# Patient Record
Sex: Female | Born: 1940 | Race: White | Hispanic: No | Marital: Single | Smoking: Never smoker
Health system: Southern US, Community
[De-identification: ages and names within clinical notes are randomized; demographics above are authoritative.]

## PROBLEM LIST (undated history)

## (undated) DIAGNOSIS — F0390 Unspecified dementia without behavioral disturbance: Secondary | ICD-10-CM

## (undated) DIAGNOSIS — S0990XA Unspecified injury of head, initial encounter: Secondary | ICD-10-CM

## (undated) DIAGNOSIS — R51 Headache: Secondary | ICD-10-CM

## (undated) DIAGNOSIS — R413 Other amnesia: Secondary | ICD-10-CM

## (undated) DIAGNOSIS — R519 Headache, unspecified: Secondary | ICD-10-CM

## (undated) HISTORY — PX: NECK SURGERY: SHX720

## (undated) HISTORY — DX: Other amnesia: R41.3

## (undated) HISTORY — DX: Unspecified injury of head, initial encounter: S09.90XA

## (undated) HISTORY — DX: Headache, unspecified: R51.9

## (undated) HISTORY — PX: HAND SURGERY: SHX662

## (undated) HISTORY — DX: Headache: R51

## (undated) HISTORY — PX: BURN TREATMENT: SHX158

## (undated) HISTORY — PX: ABDOMINAL HYSTERECTOMY: SHX81

---

## 1997-06-13 ENCOUNTER — Ambulatory Visit (HOSPITAL_COMMUNITY): Admission: RE | Admit: 1997-06-13 | Discharge: 1997-06-13 | Payer: Self-pay | Admitting: General Surgery

## 1997-07-24 ENCOUNTER — Encounter: Admission: RE | Admit: 1997-07-24 | Discharge: 1997-10-14 | Payer: Self-pay | Admitting: Anesthesiology

## 1997-10-14 ENCOUNTER — Ambulatory Visit (HOSPITAL_BASED_OUTPATIENT_CLINIC_OR_DEPARTMENT_OTHER): Admission: RE | Admit: 1997-10-14 | Discharge: 1997-10-14 | Payer: Self-pay | Admitting: Orthopedic Surgery

## 1998-05-21 ENCOUNTER — Other Ambulatory Visit: Admission: RE | Admit: 1998-05-21 | Discharge: 1998-05-21 | Payer: Self-pay | Admitting: *Deleted

## 1998-09-21 ENCOUNTER — Encounter: Payer: Self-pay | Admitting: Emergency Medicine

## 1998-09-21 ENCOUNTER — Emergency Department (HOSPITAL_COMMUNITY): Admission: EM | Admit: 1998-09-21 | Discharge: 1998-09-21 | Payer: Self-pay | Admitting: Emergency Medicine

## 1998-09-28 ENCOUNTER — Encounter: Payer: Self-pay | Admitting: Emergency Medicine

## 1998-09-28 ENCOUNTER — Emergency Department (HOSPITAL_COMMUNITY): Admission: EM | Admit: 1998-09-28 | Discharge: 1998-09-28 | Payer: Self-pay | Admitting: Emergency Medicine

## 1999-04-13 ENCOUNTER — Other Ambulatory Visit: Admission: RE | Admit: 1999-04-13 | Discharge: 1999-04-13 | Payer: Self-pay | Admitting: Obstetrics & Gynecology

## 1999-08-30 ENCOUNTER — Ambulatory Visit (HOSPITAL_COMMUNITY): Admission: RE | Admit: 1999-08-30 | Discharge: 1999-08-30 | Payer: Self-pay | Admitting: Orthopedic Surgery

## 1999-08-30 ENCOUNTER — Encounter: Payer: Self-pay | Admitting: Orthopedic Surgery

## 1999-09-13 ENCOUNTER — Encounter: Payer: Self-pay | Admitting: Orthopedic Surgery

## 1999-09-13 ENCOUNTER — Ambulatory Visit (HOSPITAL_COMMUNITY): Admission: RE | Admit: 1999-09-13 | Discharge: 1999-09-13 | Payer: Self-pay | Admitting: Orthopedic Surgery

## 2000-12-18 ENCOUNTER — Ambulatory Visit (HOSPITAL_COMMUNITY): Admission: RE | Admit: 2000-12-18 | Discharge: 2000-12-18 | Payer: Self-pay | Admitting: Orthopedic Surgery

## 2002-03-27 ENCOUNTER — Encounter: Admission: RE | Admit: 2002-03-27 | Discharge: 2002-03-27 | Payer: Self-pay | Admitting: Family Medicine

## 2002-03-27 ENCOUNTER — Encounter: Payer: Self-pay | Admitting: Family Medicine

## 2002-06-10 ENCOUNTER — Encounter (INDEPENDENT_AMBULATORY_CARE_PROVIDER_SITE_OTHER): Payer: Self-pay

## 2002-06-10 ENCOUNTER — Ambulatory Visit (HOSPITAL_COMMUNITY): Admission: RE | Admit: 2002-06-10 | Discharge: 2002-06-10 | Payer: Self-pay | Admitting: Gastroenterology

## 2002-06-27 ENCOUNTER — Emergency Department (HOSPITAL_COMMUNITY): Admission: EM | Admit: 2002-06-27 | Discharge: 2002-06-27 | Payer: Self-pay | Admitting: Internal Medicine

## 2004-02-18 ENCOUNTER — Encounter: Admission: RE | Admit: 2004-02-18 | Discharge: 2004-02-18 | Payer: Self-pay | Admitting: General Surgery

## 2004-02-25 ENCOUNTER — Encounter: Admission: RE | Admit: 2004-02-25 | Discharge: 2004-02-25 | Payer: Self-pay | Admitting: General Surgery

## 2004-02-27 ENCOUNTER — Encounter (INDEPENDENT_AMBULATORY_CARE_PROVIDER_SITE_OTHER): Payer: Self-pay | Admitting: *Deleted

## 2004-02-27 ENCOUNTER — Ambulatory Visit (HOSPITAL_BASED_OUTPATIENT_CLINIC_OR_DEPARTMENT_OTHER): Admission: RE | Admit: 2004-02-27 | Discharge: 2004-02-27 | Payer: Self-pay | Admitting: General Surgery

## 2004-02-27 ENCOUNTER — Encounter (INDEPENDENT_AMBULATORY_CARE_PROVIDER_SITE_OTHER): Payer: Self-pay | Admitting: General Surgery

## 2004-02-27 ENCOUNTER — Ambulatory Visit (HOSPITAL_COMMUNITY): Admission: RE | Admit: 2004-02-27 | Discharge: 2004-02-27 | Payer: Self-pay | Admitting: General Surgery

## 2004-03-05 ENCOUNTER — Ambulatory Visit: Payer: Self-pay | Admitting: Oncology

## 2004-03-16 ENCOUNTER — Ambulatory Visit: Admission: RE | Admit: 2004-03-16 | Discharge: 2004-05-23 | Payer: Self-pay | Admitting: Radiation Oncology

## 2004-03-16 ENCOUNTER — Encounter: Admission: RE | Admit: 2004-03-16 | Discharge: 2004-03-16 | Payer: Self-pay | Admitting: Oncology

## 2004-03-24 ENCOUNTER — Encounter: Admission: RE | Admit: 2004-03-24 | Discharge: 2004-03-24 | Payer: Self-pay | Admitting: Radiation Oncology

## 2004-05-06 ENCOUNTER — Ambulatory Visit: Payer: Self-pay | Admitting: Oncology

## 2004-06-10 ENCOUNTER — Ambulatory Visit: Admission: RE | Admit: 2004-06-10 | Discharge: 2004-06-10 | Payer: Self-pay | Admitting: Radiation Oncology

## 2004-08-10 ENCOUNTER — Ambulatory Visit: Payer: Self-pay | Admitting: Oncology

## 2004-11-05 ENCOUNTER — Ambulatory Visit: Payer: Self-pay | Admitting: Oncology

## 2005-02-04 ENCOUNTER — Ambulatory Visit: Payer: Self-pay | Admitting: Oncology

## 2009-01-30 ENCOUNTER — Emergency Department (HOSPITAL_COMMUNITY): Admission: EM | Admit: 2009-01-30 | Discharge: 2009-01-30 | Payer: Self-pay | Admitting: Emergency Medicine

## 2010-06-11 NOTE — Op Note (Signed)
[facility]  Patient:    [name], [name] Visit Number: [id number] MRN: [id number]          Service Type: DSU Location: DAY Attending Physician:  Verlee Rossetti. Dictated by:   Almedia Balls Ranell Patrick, M.D. Proc. Date: 12/18/00 Admit Date:  12/18/2000                             Operative Report  PREOPERATIVE DIAGNOSES: 1. Left shoulder impingement syndrome, chronic. 2. Left shoulder acromioclavicular joint arthritis.  POSTOPERATIVE DIAGNOSES: 1. Left shoulder impingement syndrome, chronic. 3. Left shoulder acromioclavicular joint arthritis.  PROCEDURE PERFORMED:  Left shoulder exam under anesthesia, diagnostic glenohumeral arthroscopy, debridement of superior labral fraying and arthroscopic subacromial decompression followed by open distal clavicle excision.  SURGEON:  Almedia Balls. Ranell Patrick, M.D.  FIRST ASSISTANT:  Dooley L. Underwood III, P.A.-C.  ANESTHESIA:  Intrascalene block plus general anesthesia.  ESTIMATED BLOOD LOSS:  Minimal.  FLUID REPLACEMENT:  1200 cc crystalloid.  INSTRUMENT COUNT:  Correct.  COMPLICATIONS:  None.  Preoperative antibiotics were given.  INDICATIONS:  The patient is a 70 year old female, who presents complaining of longstanding left shoulder pain.  She complains of pain with activities away from her body and over her head.  In addition, she has pain with cross arm abduction.  On x-rays, she has evidence of AC joint arthritis and subacromial spur formation.  MRI scan demonstrates changes within the supraspinatus tendon consistent with chronic impingement syndrome.  The patient has failed longstanding conservative management with activity modification, physical therapy, and cortisone injections in the subacromial space.  She presents now desiring subacromial decompression and AC joint resection.  DESCRIPTION OF PROCEDURE:  After an adequate level of general anesthesia plus intrascalene block anesthesia was achieved  plus 1 g of Ancef was given preoperatively, the patient was positioned in the modified beach chair position.  All neurovascular structures were padded appropriately.  The left shoulder was then examined under anesthesia, and there was noted to be a negative sulcus, negative anterior-posterior drawer external rotation of 45 degrees, forward flexion of 130, abduction 110.  After completion of EUA, the left shoulder was prepped and draped in its entirety in the usual sterile fashion.  Diagnostic arthroscopy was performed through standard posterior and anterior arthroscopic portals, created in a similar fashion with infiltration of the skin with 0.5% Marcaine with epinephrine followed by incision with an 11 blade scalpel and introduction of cannula into the joint using the blunt obturator.  Diagnostic arthroscopy revealed normal biceps anchor.  There was some mild fraying of the superior labrum debrided with a full-radius resector. The remainder of the labrum appeared normal.  Subscapularis appeared normal with a normal insertion on the humerus.  The rotator cuff also was normal. There were no loose bodies noted in the pouch which was of normal size. Glenohumeral articular cartilage appeared intact.  After completion of glenohumeral arthroscopy, the scope was placed in the subacromial space, and bursectomy was performed, and then an arthroscopic subacromial decompression with care towards removing anterior and lateral osteophytes.  The [facility] joint was visualized, and the acromioplasty was taken all the way over to the [facility] joint where small acromioclavicular osteophytes were noted.  At this point, the scope was concluded.  A small saber incision was created over the distal clavicle in line with Langers lines.  This was done using a 10 blade scalpel. Dissection was carried sharply down through subcutaneous tissues using Bovie electrocautery.  Next the deltotrapezius fascia was identified and incised  in line with the distal clavicle.  Subperiosteal dissection of the distal clavicle was performed, and then the distal 1 cm distal clavicle was removed using an oscillating saw.  The operating surgeons finger was then placed in the interval and the arm maximally adducted and abducted to ensure an adequate resection.  Thorough irrigation was performed followed by application bone wax to the end of the clavicle.  Again, the [facility] interval was irrigated.  The deltotrapezius fascia was repaired using interrupted figure-of-eight 0 Ethibond sutures with buried knots.  The the subcu was closed using 2-0 Vicryl followed by skin with a 4-0 running Monocryl subcuticular.  The portals were also closed using 4-0 Monocryl subcuticular sutures.  Steri-Strips were applied followed by sterile dressing and a shoulder sling.  The patient was awakened, extubated, and taken to PACU in stable condition having tolerated surgery well. Dictated by:   Almedia Balls Ranell Patrick, M.D. Attending Physician:  Malon Kindle R. DD:  12/18/00 TD:  12/18/00 Job: [id number]

## 2010-06-11 NOTE — Op Note (Signed)
   [name], [name]                          ACCOUNT NO.:  [id number]   MEDICAL RECORD NO.:  [id number]                   PATIENT TYPE:  AMB   LOCATION:  ENDO                                 FACILITY:  [facility]   PHYSICIAN:  James L. Malon Kindle., M.D.          DATE OF BIRTH:  07-16-1940   DATE OF PROCEDURE:  06/10/2002  DATE OF DISCHARGE:                                 OPERATIVE REPORT   PROCEDURE:  Colonoscopy and coagulation of polyps.   MEDICATIONS:  Fentanyl 87.5 mcg, Versed 7 mg IV.   SCOPE:  Olympus pediatric colonoscope   INDICATIONS:  Colon cancer screening.   DESCRIPTION OF PROCEDURE:  The procedure had been explained to the patient  and consent obtained.  The patient was placed in the left lateral decubitus  position.  The Olympus adjustable pediatric video colonoscope was inserted  and advanced.  The patient had a long tortuous colon. Using abdominal  pressure, we were able to reach the cecum.  The ileocecal valve and  appendiceal orifice were seen.  The scope was withdrawn and the  cecum,  ascending colon, transverse colon, descending, and sigmoid colon were seen  well.  No significant diverticula disease.  The scope was withdrawn down  into the rectum.  The rectum did reveal a 3-4 mm sessile polyp that was  cauterized with the hot biopsy forceps.  No other polyps were seen.  There  were a few small internal hemorrhoids.  The scope was withdrawn.  The  patient tolerated the procedure well.  She was maintained on low-flow oxygen  with pulse oximetry throughout the procedure.   ASSESSMENT:  1. Rectal polyp cauterized.  2. Small internal hemorrhoids.   PLAN:  Will check pathology and make further recommendations depending on  the results and we will go ahead and routine post polypectomy precautions.                                                James L. Malon Kindle., M.D.    Waldron Session  D:  06/10/2002  T:  06/10/2002  Job:  [id number]

## 2010-06-11 NOTE — Op Note (Signed)
[name], Villarreal                ACCOUNT NO.:  [id number]   MEDICAL RECORD NO.:  [id number]          PATIENT TYPE:  AMB   LOCATION:  DSC                          FACILITY:  [facility]   PHYSICIAN:  Sharlet Salina T. Hoxworth, M.D.DATE OF BIRTH:  January 11, 1941   DATE OF PROCEDURE:  02/27/2004  DATE OF DISCHARGE:                                 OPERATIVE REPORT   PREOPERATIVE DIAGNOSIS:  Carcinoma of right breast.   POSTOPERATIVE DIAGNOSIS:  Carcinoma of right breast.   SURGICAL PROCEDURE:  1.  Blue dye injection of right breast.  2.  Right axillary sentinel lymph node biopsy.  3.  Right breast lumpectomy.   SURGEON:  Lorne Skeens. Hoxworth, M.D.   ANESTHESIA:  Laryngeal mask general.   BRIEF HISTORY:  [name] is a 70 year old female who recently noticed a  small lump in her upper breast.  Mammogram and ultrasound confirmed a 1.5-cm  suspicious mass in the upper right breast at 1 o'clock position and  ultrasound-guided large-core needle biopsy has revealed invasive ductal  carcinoma.  MRI was negative for any other areas of concern.  Treatment  options were discussed extensively and we elected to proceed with right  axillary sentinel lymph node biopsy with possible axillary dissection and  right lumpectomy.  The nature of the procedure, indications, risks of  bleeding, infection and possible for further surgery based on final  pathology were discussed and understood.  She is now brought to the  operating room for this procedure.   DESCRIPTION OF OPERATION:  The patient's right breast was injected  circumareolarly with Tc 9m sulfur colloid approximately 2 hours prior to  the procedure.  She was brought to the operating room and placed in supine  position on the operating table, and laryngeal mask general anesthesia was  induced.  The right chest, axilla and arm were sterilely prepped and draped.  Prior to draping, under sterile technique, 10 mL of Lymphazurin blue had  been injected  subareolarly and massaged for several minutes.  The Neoprobe  was used to localize a definite hot area in the right axilla.  A small  incision was made over this and dissection carried down through the  subcutaneous tissue and clavipectoral fascia with cautery.  Using careful  blunt dissection and directed by the Neoprobe, dissection was carried down  onto a bright blue lymph node which was very hot on Neoprobe.  This was  dissected free from the surrounding tissue using blunt and cautery  dissection, and was removed intact.  Ex vivo, the node had counts of 700-800  with background of only about 10.  This was sent as the hot blue sentinel  lymph node.  While awaiting this result, I proceeded with the lumpectomy.  The palpable mass was quite high in the breast, a little bit medial to  midline.  A curvilinear incision was made over the mass and dissection  carried down into the subcutaneous tissue.  The breast tissue was fairly  thin at this point; I did have to create short skin flaps superiorly and  inferiorly.  Dissection was then deepened  down surrounding the mass with  nice margins in all direction.  Because of again very thin breast tissue in  this area, the dissection was deepened down to the level of the pectoralis  muscle and the specimen reflected up off the pectoralis muscle including the  pectoralis fascia with the specimen, as it was deep and in thin breast  tissue.  It appeared completely excised.  The specimen was oriented and sent  for permanent exam.  At this point, the sentinel lymph node touch prep  returned as negative for malignant cells.  Each incision was infiltrated  with Marcaine and complete hemostasis assured.  Each incision was closed  with interrupted subcutaneous 4-0 Monocryl and running subcuticular 4-0  Monocryl and Steri-Strips.  Sponge and instrument counts were correct.  Dry  sterile dressings were applied and the patient taken to the recovery room in  good  condition.      BTH/MEDQ  D:  02/27/2004  T:  02/28/2004  Job:  [id number]

## 2013-06-26 ENCOUNTER — Encounter (HOSPITAL_COMMUNITY): Payer: Self-pay | Admitting: Emergency Medicine

## 2013-06-26 ENCOUNTER — Emergency Department (HOSPITAL_COMMUNITY): Payer: Medicare Other

## 2013-06-26 ENCOUNTER — Emergency Department (HOSPITAL_COMMUNITY)
Admission: EM | Admit: 2013-06-26 | Discharge: 2013-06-26 | Disposition: A | Discharge status: Home / Self Care | Payer: Medicare Other | Attending: Emergency Medicine | Admitting: Emergency Medicine

## 2013-06-26 DIAGNOSIS — Z9889 Other specified postprocedural states: Secondary | ICD-10-CM | POA: Insufficient documentation

## 2013-06-26 DIAGNOSIS — G8911 Acute pain due to trauma: Secondary | ICD-10-CM | POA: Insufficient documentation

## 2013-06-26 DIAGNOSIS — M542 Cervicalgia: Secondary | ICD-10-CM | POA: Insufficient documentation

## 2013-06-26 MED ORDER — HYDROCODONE-ACETAMINOPHEN 5-325 MG PO TABS
1.0000 | ORAL_TABLET | Freq: Four times a day (QID) | ORAL | Status: DC | PRN
Start: 2013-06-26 — End: 2015-07-20

## 2013-06-26 NOTE — ED Notes (Signed)
MD at bedside. 

## 2013-06-26 NOTE — ED Notes (Signed)
Pt reports approx 1 year ago fell and has had pain in neck off and on since.  Reports has been having a hard time getting medical treatment because of medicare.    Pt reports has referral to Dr. Danielle Dess and has appt in July.  Pt says Dr. Tenny Craw set up an appt for physical therapy.  Reports was at PT today and sent here for evaluation.  Pt says was concerned about doing the PT because of her neck pain.  Pt says has stopped breathing before due to her neck.

## 2013-06-26 NOTE — Discharge Instructions (Signed)
Follow up with Dr. Danielle Dess as planned.  Return if needed
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
## 2013-08-08 ENCOUNTER — Other Ambulatory Visit: Payer: Self-pay | Admitting: Neurological Surgery

## 2013-08-08 DIAGNOSIS — M5000 Cervical disc disorder with myelopathy, unspecified cervical region: Secondary | ICD-10-CM

## 2013-08-19 ENCOUNTER — Ambulatory Visit
Admission: RE | Admit: 2013-08-19 | Discharge: 2013-08-19 | Disposition: A | Discharge status: Home / Self Care | Payer: Medicare Other | Source: Ambulatory Visit | Attending: Neurological Surgery | Admitting: Neurological Surgery

## 2013-08-19 DIAGNOSIS — M5000 Cervical disc disorder with myelopathy, unspecified cervical region: Secondary | ICD-10-CM

## 2015-07-20 ENCOUNTER — Ambulatory Visit (INDEPENDENT_AMBULATORY_CARE_PROVIDER_SITE_OTHER): Payer: Medicare Other | Admitting: Neurology

## 2015-07-20 ENCOUNTER — Encounter: Payer: Self-pay | Admitting: Neurology

## 2015-07-20 VITALS — BP 146/88 | HR 72 | Ht 66.0 in | Wt 137.8 lb

## 2015-07-20 DIAGNOSIS — G44221 Chronic tension-type headache, intractable: Secondary | ICD-10-CM | POA: Diagnosis not present

## 2015-07-20 DIAGNOSIS — G3184 Mild cognitive impairment, so stated: Secondary | ICD-10-CM

## 2015-07-20 DIAGNOSIS — G43109 Migraine with aura, not intractable, without status migrainosus: Secondary | ICD-10-CM | POA: Diagnosis not present

## 2015-07-20 DIAGNOSIS — G43909 Migraine, unspecified, not intractable, without status migrainosus: Secondary | ICD-10-CM | POA: Insufficient documentation

## 2015-07-20 DIAGNOSIS — G8929 Other chronic pain: Secondary | ICD-10-CM | POA: Insufficient documentation

## 2015-07-20 DIAGNOSIS — R519 Headache, unspecified: Secondary | ICD-10-CM | POA: Insufficient documentation

## 2015-07-20 DIAGNOSIS — R51 Headache: Secondary | ICD-10-CM

## 2015-07-20 MED ORDER — SUMATRIPTAN SUCCINATE 25 MG PO TABS: 25.0000 mg | ORAL_TABLET | ORAL | Status: DC | PRN

## 2015-07-20 MED ORDER — NORTRIPTYLINE HCL 10 MG PO CAPS: ORAL_CAPSULE | ORAL | Status: DC
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
## 2015-07-22 DIAGNOSIS — G3184 Mild cognitive impairment, so stated: Secondary | ICD-10-CM | POA: Insufficient documentation

## 2015-09-15 ENCOUNTER — Ambulatory Visit: Payer: Medicare Other | Admitting: Neurology

## 2018-09-04 ENCOUNTER — Encounter (HOSPITAL_COMMUNITY): Payer: Self-pay | Admitting: Emergency Medicine

## 2018-09-04 ENCOUNTER — Emergency Department (HOSPITAL_COMMUNITY): Payer: Medicare Other

## 2018-09-04 ENCOUNTER — Emergency Department (HOSPITAL_COMMUNITY)
Admission: EM | Admit: 2018-09-04 | Discharge: 2018-09-04 | Disposition: A | Discharge status: Home / Self Care | Payer: Medicare Other | Attending: Emergency Medicine | Admitting: Emergency Medicine

## 2018-09-04 ENCOUNTER — Other Ambulatory Visit: Payer: Self-pay

## 2018-09-04 DIAGNOSIS — W228XXA Striking against or struck by other objects, initial encounter: Secondary | ICD-10-CM | POA: Insufficient documentation

## 2018-09-04 DIAGNOSIS — Z23 Encounter for immunization: Secondary | ICD-10-CM | POA: Insufficient documentation

## 2018-09-04 DIAGNOSIS — Y9289 Other specified places as the place of occurrence of the external cause: Secondary | ICD-10-CM | POA: Diagnosis not present

## 2018-09-04 DIAGNOSIS — S4991XA Unspecified injury of right shoulder and upper arm, initial encounter: Secondary | ICD-10-CM

## 2018-09-04 DIAGNOSIS — Y9389 Activity, other specified: Secondary | ICD-10-CM | POA: Insufficient documentation

## 2018-09-04 DIAGNOSIS — Y998 Other external cause status: Secondary | ICD-10-CM | POA: Insufficient documentation

## 2018-09-04 DIAGNOSIS — S51841A Puncture wound with foreign body of right forearm, initial encounter: Secondary | ICD-10-CM | POA: Diagnosis present

## 2018-09-04 DIAGNOSIS — M795 Residual foreign body in soft tissue: Secondary | ICD-10-CM

## 2018-09-04 MED ORDER — OXYCODONE-ACETAMINOPHEN 5-325 MG PO TABS
1.0000 | ORAL_TABLET | Freq: Once | ORAL | Status: AC
  Administered 2018-09-04: 1 via ORAL
  Filled 2018-09-04: qty 1

## 2018-09-04 MED ORDER — TETANUS-DIPHTH-ACELL PERTUSSIS 5-2.5-18.5 LF-MCG/0.5 IM SUSP
0.5000 mL | Freq: Once | INTRAMUSCULAR | Status: AC
  Administered 2018-09-04: 17:00:00 0.5 mL via INTRAMUSCULAR
  Filled 2018-09-04: qty 0.5

## 2018-09-04 MED ORDER — OXYCODONE-ACETAMINOPHEN 5-325 MG PO TABS: 1.0000 | ORAL_TABLET | ORAL | 0 refills | Status: AC | PRN

## 2018-09-04 MED ORDER — CLINDAMYCIN HCL 150 MG PO CAPS: 450.0000 mg | ORAL_CAPSULE | Freq: Three times a day (TID) | ORAL | 0 refills | Status: AC

## 2018-09-04 MED ORDER — LIDOCAINE HCL (PF) 1 % IJ SOLN
5.0000 mL | Freq: Once | INTRAMUSCULAR | Status: AC
  Administered 2018-09-04: 5 mL via INTRADERMAL
  Filled 2018-09-04: qty 6

## 2018-09-04 NOTE — ED Provider Notes (Signed)
Medical screening examination/treatment/procedure(s) were conducted as a shared visit with non-physician practitioner(s) and myself.  I personally evaluated the patient during the encounter.    77yF with lacerations to R forearm caused by tree branch.  She thinks it came out largely intact. Through and through on dorsal aspect. No gross contamination that I can obviously tell but there is quite a bit of soft tissue in between the two wounds that can't be visualized unless you significantly extended them. NVI. XR w/o obvious FB. We will anesthetize and aggressive irrigate the wounds. Since caused by friable organic material, I think best to leave them open. Discussed with patient. Will need diligent wound care. Will place on abx. Return precautions discussed.    Virgel Manifold, MD 09/04/18 [phone]

## 2018-09-04 NOTE — ED Notes (Signed)
Advised patient not to drive after discharge due to narcotic medication administration. Also advised patient not to drive while taking prescription pain medications. Patient verbalized understanding. Discharged to waiting room to wait for ride home.

## 2018-09-04 NOTE — ED Provider Notes (Signed)
[facility]ENCY [facility] Provider Note   CSN: [id number] Arrival date & time: 09/04/18  1501    History   Chief Complaint Chief Complaint  Patient presents with   Arm Injury    HPI [name] is a 78 y.o. female.     78 y.o female with a PMH of Migraine presents to the ED s/p tree injury. Patient reports she was mowing the lawn when she cut a tree branch and witnessed a tree branch go into her right forearm. She reports pain along the dorsum aspect of the forearm, states its worse with palpation along with the feeling of being there and with extension of the wrist. She has not tried any medication for relieve in symptoms. Reports no other injuries or trauma.   The history is provided by the patient and medical records.  Arm Injury Associated symptoms: no back pain and no fever     Past Medical History:  Diagnosis Date   Head injury    Headache    Memory loss     Patient Active Problem List   Diagnosis Date Noted   Mild cognitive impairment 07/22/2015   Chronic headache 07/20/2015   Migraine 07/20/2015    Past Surgical History:  Procedure Laterality Date   ABDOMINAL HYSTERECTOMY     BURN TREATMENT     HAND SURGERY     NECK SURGERY       OB History   No obstetric history on file.      Home Medications    Prior to Admission medications   Medication Sig Start Date End Date Taking? Authorizing Provider  Aspirin Buf,CaCarb-MgCarb-MgO, (BUFFERIN PO) Take by mouth as needed.    [provider]  clindamycin (CLEOCIN) 150 MG capsule Take 3 capsules (450 mg total) by mouth 3 (three) times daily for 7 days. 09/04/18 09/11/18  Claude MangesSoto, Derius Ghosh, PA-C  Multiple Vitamin (MULTIVITAMIN) tablet Take 1 tablet by mouth daily.    [provider]  nortriptyline (PAMELOR) 10 MG capsule One po qhs xone week, then 2 tabs po qhs 07/20/15   Levert FeinsteinYan, Yijun, MD  oxyCODONE-acetaminophen (PERCOCET/ROXICET) 5-325 MG tablet Take 1 tablet by mouth every 4  (four) hours as needed for up to 3 days for severe pain. 09/04/18 09/07/18  Claude MangesSoto, Taleigh Gero, PA-C  SUMAtriptan (IMITREX) 25 MG tablet Take 1 tablet (25 mg total) by mouth every 2 (two) hours as needed for migraine. May repeat in 2 hours if headache persists or recurs. 07/20/15   Levert FeinsteinYan, Yijun, MD  vitamin C (ASCORBIC ACID) 500 MG tablet Take 500 mg by mouth daily.    [provider]  vitamin E 400 UNIT capsule Take 400 Units by mouth daily.    [provider]    Family History Family History  Problem Relation Age of Onset   Pneumonia Father    Lung cancer Mother     Social History Social History   Tobacco Use   Smoking status: Never Smoker   Smokeless tobacco: Never Used   Tobacco comment: Quit [phone]  Substance Use Topics   Alcohol use: No    Alcohol/week: 0.0 standard drinks   Drug use: No     Allergies   Patient has no known allergies.   Review of Systems Review of Systems  Constitutional: Negative for fever.  HENT: Negative for sore throat.   Eyes: Negative for redness.  Respiratory: Negative for cough.   Gastrointestinal: Negative for abdominal pain, nausea and vomiting.  Genitourinary: Negative for flank  pain.  Musculoskeletal: Positive for myalgias. Negative for back pain.  Skin: Positive for wound.  Neurological: Negative for light-headedness and headaches.     Physical Exam Updated Vital Signs BP (!) 151/92 (BP Location: Left Arm)    Pulse (!) 110    Temp 98.1 F (36.7 C) (Oral)    Resp 16    Ht 5\' 6"  (1.676 m)    Wt 56.7 kg    SpO2 95%    BMI 20.18 kg/m   Physical Exam Vitals signs and nursing note reviewed.  Constitutional:      General: She is not in acute distress.    Appearance: She is well-developed.  HENT:     Head: Normocephalic and atraumatic.     Mouth/Throat:     Pharynx: No oropharyngeal exudate.  Eyes:     Pupils: Pupils are equal, round, and reactive to light.  Neck:     Musculoskeletal: Normal range of motion.    Cardiovascular:     Rate and Rhythm: Regular rhythm.     Heart sounds: Normal heart sounds.  Pulmonary:     Effort: Pulmonary effort is normal. No respiratory distress.     Breath sounds: Normal breath sounds.  Abdominal:     General: Bowel sounds are normal. There is no distension.     Palpations: Abdomen is soft.     Tenderness: There is no abdominal tenderness.  Musculoskeletal:        General: No deformity.     Right forearm: She exhibits tenderness and swelling. She exhibits no bony tenderness, no edema, no deformity and no laceration.     Right lower leg: No edema.     Left lower leg: No edema.     Comments: Significant swelling, tenderness, bruising noted to right forearm, entrance and exit wound observed, however patient reports tree branch on right forearm. Pulses present, pain with wrist extension.   Skin:    General: Skin is warm and dry.  Neurological:     Mental Status: She is alert and oriented to person, place, and time.      ED Treatments / Results  Labs (all labs ordered are listed, but only abnormal results are displayed) Labs Reviewed - No data to display  EKG None  Radiology Dg Forearm Right  Result Date: 09/04/2018 CLINICAL DATA:  Distal right forearm injury by tree branch, swelling with puncture wound EXAM: RIGHT FOREARM - 2 VIEW COMPARISON:  None. FINDINGS: Punctate subcutaneous gas in the soft tissues along the dorsal and radial aspect of the distal right forearm with associated soft tissue swelling and skin laceration. No radiopaque foreign body is visualized. No subjacent fracture or traumatic malalignment of the right forearm. Alignment at the elbow is grossly preserved with minimal degenerative changes in TS pathic spurring of the olecranon. There is mild negative ulnar variance at the wrist as well as radiocarpal and intercarpal arthrosis. IMPRESSION: Punctate subcutaneous gas in the soft tissues along the dorsal and radial aspect of the distal right  forearm with associated soft tissue swelling and skin laceration. No radiopaque foreign body or acute osseous abnormality. Degenerative changes of the elbow and wrist with negative ulnar variance. Electronically Signed   By: Kreg ShropshirePrice  DeHay M.D.   On: 09/04/2018 15:42     Procedures Irrigation  Date/Time: 09/04/2018 4:45 PM Performed by: Claude MangesSoto, Kathia Covington, PA-C Authorized by: Claude MangesSoto, Jorgina Binning, PA-C  Consent: Verbal consent obtained. Consent given by: patient Patient identity confirmed: verbally with patient and hospital-assigned identification number Local anesthesia  used: yes  Anesthesia: Local anesthesia used: yes Local Anesthetic: lidocaine 1% without epinephrine Anesthetic total: 2 mL  Sedation: Patient sedated: no  Patient tolerance: patient tolerated the procedure well with no immediate complications Comments: Patient's wound extensively irrigated with thousand mL's of normal saline.  Unable to remove foreign body tree branch.  Wrapped with nonadherent dressing and bulky dressing provided.    (including critical care time)  Medications Ordered in ED Medications  Tdap (BOOSTRIX) injection 0.5 mL (has no administration in time range)  lidocaine (PF) (XYLOCAINE) 1 % injection 5 mL (5 mLs Intradermal Given 09/04/18 1551)  oxyCODONE-acetaminophen (PERCOCET/ROXICET) 5-325 MG per tablet 1 tablet (1 tablet Oral Given 09/04/18 1551)     Initial Impression / Assessment and Plan / ED Course  I have reviewed the triage vital signs and the nursing notes.  Pertinent labs & imaging results that were available during my care of the patient were reviewed by me and considered in my medical decision making (see chart for details).    Patient presents status post injury of the right forearm while mowing her lawn, states she had a small tree branch go into her right forearm.  Significant bleeding was noted on arrival in triage, bleeding has stopped on my exam.  2 wounds are noted on entrance wound along  with an exit wound.  Unsure whether foreign bodies remains in right forearm.  I have discussed patient with Dr. Wilson Singer who has seen patient and agrees with management and plan.   Right forearm xray showed: Punctate subcutaneous gas in the soft tissues along the dorsal and  radial aspect of the distal right forearm with associated soft  tissue swelling and skin laceration. No radiopaque foreign body or  acute osseous abnormality.    Degenerative changes of the elbow and wrist with negative ulnar  variance.   4:51 PM Patient's wound was extensively irrigated with 1 L of saline, pressure was applied during irrigation.  Patient was dressed with nonadherent dressing along with bulky dressing applied.  Tetanus vaccine was updated on today's visit.  She will also go home on clindamycin to prevent infection.  She is advised to return within 48 hours for a wound recheck.  Patient stable for discharge, with stable vital signs.  Patient stable for discharge.   Portions of this note were generated with [employer]. Dictation errors may occur despite best attempts at proofreading.  Final Clinical Impressions(s) / ED Diagnoses   Final diagnoses:  Injury of right upper extremity, initial encounter  Foreign body (FB) in soft tissue    ED Discharge Orders         Ordered    oxyCODONE-acetaminophen (PERCOCET/ROXICET) 5-325 MG tablet  Every 4 hours PRN     09/04/18 1616    clindamycin (CLEOCIN) 150 MG capsule  3 times daily     09/04/18 1646           Janeece Fitting, PA-C 09/04/18 1654    Virgel Manifold, MD 09/06/18 [phone]

## 2018-09-04 NOTE — Discharge Instructions (Addendum)
I have prescribed antibiotics to help prevent infection to your right forearm.  Please take 3 tablets three times a day for the next 7 days.  Please keep your wound clean and dry, I provided extra dressing to help with this.  Please return to the emergency department within 48 hours for a wound recheck.  Your tetanus status was updated today.  If you experience any drainage, fever, worsening pain please return prior to 48 hours.

## 2018-09-04 NOTE — ED Triage Notes (Signed)
Patient states she was cutting grass today and a "tree branch went into my arm." Patient bleeding from right forearm with significant swelling at triage.

## 2018-09-06 ENCOUNTER — Other Ambulatory Visit: Payer: Self-pay

## 2018-09-06 ENCOUNTER — Encounter (HOSPITAL_COMMUNITY): Payer: Self-pay | Admitting: Emergency Medicine

## 2018-09-06 ENCOUNTER — Emergency Department (HOSPITAL_COMMUNITY)
Admission: EM | Admit: 2018-09-06 | Discharge: 2018-09-06 | Disposition: A | Discharge status: Home / Self Care | Payer: Medicare Other | Attending: Emergency Medicine | Admitting: Emergency Medicine

## 2018-09-06 DIAGNOSIS — Z5189 Encounter for other specified aftercare: Secondary | ICD-10-CM

## 2018-09-06 DIAGNOSIS — Z79899 Other long term (current) drug therapy: Secondary | ICD-10-CM | POA: Diagnosis not present

## 2018-09-06 DIAGNOSIS — Z4800 Encounter for change or removal of nonsurgical wound dressing: Secondary | ICD-10-CM | POA: Insufficient documentation

## 2018-09-06 NOTE — ED Provider Notes (Signed)
[facility] Provider [facility]   CSN: [id number] Arrival date & time: 09/06/18  1103     History   Chief Complaint Chief Complaint  Patient presents with  . Wound Check    HPI Luiz Blarelaine L Sosinski is a 78 y.o. female.     The history is provided by the patient. No language interpreter was used.  Wound Check This is a recurrent problem. The current episode started 2 days ago. The problem occurs constantly. Nothing aggravates the symptoms. Nothing relieves the symptoms. She has tried nothing for the symptoms. The treatment provided no relief.  Pt is here for a wound check.   Pt  had a puncture wound.  She was seen here 2 days ago and advised to recheck here.  Pt reports she is healing well                                    Past Medical History:  Diagnosis Date  . Head injury   . Headache   . Memory loss     Patient Active Problem List   Diagnosis Date Noted  . Mild cognitive impairment 07/22/2015  . Chronic headache 07/20/2015  . Migraine 07/20/2015    Past Surgical History:  Procedure Laterality Date  . ABDOMINAL HYSTERECTOMY    . BURN TREATMENT    . HAND SURGERY    . NECK SURGERY       OB History    Gravida  3   Para  3   Term  3   Preterm      AB      Living        SAB      TAB      Ectopic      Multiple      Live Births               Home Medications    Prior to Admission medications   Medication Sig Start Date End Date Taking? Authorizing Provider  Aspirin Buf,CaCarb-MgCarb-MgO, (BUFFERIN PO) Take by mouth as needed.    [provider]  clindamycin (CLEOCIN) 150 MG capsule Take 3 capsules (450 mg total) by mouth 3 (three) times daily for 7 days. 09/04/18 09/11/18  Claude MangesSoto, Johana, PA-C  Multiple Vitamin (MULTIVITAMIN) tablet Take 1 tablet by mouth daily.    [provider]  nortriptyline (PAMELOR) 10 MG capsule One po qhs xone week, then 2 tabs po qhs 07/20/15   Levert FeinsteinYan, Yijun,  MD  oxyCODONE-acetaminophen (PERCOCET/ROXICET) 5-325 MG tablet Take 1 tablet by mouth every 4 (four) hours as needed for up to 3 days for severe pain. 09/04/18 09/07/18  Claude MangesSoto, Johana, PA-C  SUMAtriptan (IMITREX) 25 MG tablet Take 1 tablet (25 mg total) by mouth every 2 (two) hours as needed for migraine. May repeat in 2 hours if headache persists or recurs. 07/20/15   Levert FeinsteinYan, Yijun, MD  vitamin C (ASCORBIC ACID) 500 MG tablet Take 500 mg by mouth daily.    [provider]  vitamin E 400 UNIT capsule Take 400 Units by mouth daily.    [provider]    Family History Family History  Problem Relation Age of Onset  . Pneumonia Father   . Lung cancer Mother     Social History Social History   Tobacco Use  . Smoking status: Never Smoker  . Smokeless tobacco: Never Used  . Tobacco comment:  Quit [id number]  Substance Use Topics  . Alcohol use: No    Alcohol/week: 0.0 standard drinks  . Drug use: No     Allergies   Patient has no known allergies.   Review of Systems Review of Systems  All other systems reviewed and are negative.    Physical Exam Updated Vital Signs BP (!) 156/101 (BP Location: Left Arm)   Pulse 95   Temp 98.2 F (36.8 C) (Oral)   Resp 18   SpO2 96%   Physical Exam Vitals signs reviewed.  Musculoskeletal: Normal range of motion.  Skin:    General: Skin is warm.     Comments: Healing skin lacerations forearm,  No sign of infection   Neurological:     General: No focal deficit present.  Psychiatric:        Mood and Affect: Mood normal.      ED Treatments / Results  Labs (all labs ordered are listed, but only abnormal results are displayed) Labs Reviewed - No data to display  EKG None  Radiology Dg Forearm Right  Result Date: 09/04/2018 CLINICAL DATA:  Distal right forearm injury by tree branch, swelling with puncture wound EXAM: RIGHT FOREARM - 2 VIEW COMPARISON:  None. FINDINGS: Punctate subcutaneous gas in the soft tissues  along the dorsal and radial aspect of the distal right forearm with associated soft tissue swelling and skin laceration. No radiopaque foreign body is visualized. No subjacent fracture or traumatic malalignment of the right forearm. Alignment at the elbow is grossly preserved with minimal degenerative changes in TS pathic spurring of the olecranon. There is mild negative ulnar variance at the wrist as well as radiocarpal and intercarpal arthrosis. IMPRESSION: Punctate subcutaneous gas in the soft tissues along the dorsal and radial aspect of the distal right forearm with associated soft tissue swelling and skin laceration. No radiopaque foreign body or acute osseous abnormality. Degenerative changes of the elbow and wrist with negative ulnar variance. Electronically Signed   By: Lovena Le M.D.   On: 09/04/2018 15:42    Procedures Procedures (including critical care time)  Medications Ordered in ED Medications - No data to display   Initial Impression / Assessment and Plan / ED Course  I have reviewed the triage vital signs and the nursing notes.  Pertinent labs & imaging results that were available during my care of the patient were reviewed by me and considered in my medical decision making (see chart for details).        MDM   Pt advised to continue antibiotics.  She is advised toreturn if any problems.   Final Clinical Impressions(s) / ED Diagnoses   Final diagnoses:  Visit for wound check    ED Discharge Orders    None    An After Visit Summary was printed and given to the patient.    Fransico Meadow, [place] 09/06/18 1318    Lucrezia Starch, MD 09/07/18 [phone]

## 2018-09-06 NOTE — ED Notes (Signed)
ED Provider at bedside. 

## 2018-09-06 NOTE — Discharge Instructions (Signed)
Watch for infection.  Return if any problems.  

## 2018-09-06 NOTE — ED Triage Notes (Signed)
Patient seen here yesterday for treatment of a wound to her R arm. Told to return for a wound recheck today.

## 2019-03-27 ENCOUNTER — Other Ambulatory Visit: Payer: Self-pay | Admitting: Oncology

## 2019-05-10 ENCOUNTER — Emergency Department (HOSPITAL_COMMUNITY): Payer: Medicare Other

## 2019-05-10 ENCOUNTER — Other Ambulatory Visit: Payer: Self-pay

## 2019-05-10 ENCOUNTER — Emergency Department (HOSPITAL_COMMUNITY)
Admission: EM | Admit: 2019-05-10 | Discharge: 2019-05-11 | Disposition: A | Discharge status: Home / Self Care | Payer: Medicare Other | Attending: Emergency Medicine | Admitting: Emergency Medicine

## 2019-05-10 ENCOUNTER — Encounter (HOSPITAL_COMMUNITY): Payer: Self-pay | Admitting: Pediatrics

## 2019-05-10 DIAGNOSIS — Z79899 Other long term (current) drug therapy: Secondary | ICD-10-CM | POA: Insufficient documentation

## 2019-05-10 DIAGNOSIS — M79671 Pain in right foot: Secondary | ICD-10-CM | POA: Diagnosis not present

## 2019-05-10 DIAGNOSIS — M542 Cervicalgia: Secondary | ICD-10-CM | POA: Insufficient documentation

## 2019-05-10 DIAGNOSIS — Z5321 Procedure and treatment not carried out due to patient leaving prior to being seen by health care provider: Secondary | ICD-10-CM | POA: Diagnosis not present

## 2019-05-10 DIAGNOSIS — R519 Headache, unspecified: Secondary | ICD-10-CM | POA: Insufficient documentation

## 2019-05-10 LAB — COMPREHENSIVE METABOLIC PANEL
ALT: 12 U/L (ref 0–44)
AST: 20 U/L (ref 15–41)
Albumin: 3.5 g/dL (ref 3.5–5.0)
Alkaline Phosphatase: 90 U/L (ref 38–126)
Anion gap: 11 (ref 5–15)
BUN: 15 mg/dL (ref 8–23)
CO2: 23 mmol/L (ref 22–32)
Calcium: 10.2 mg/dL (ref 8.9–10.3)
Chloride: 101 mmol/L (ref 98–111)
Creatinine, Ser: 0.72 mg/dL (ref 0.44–1.00)
GFR calc Af Amer: >60 mL/min (ref >60)
GFR calc non Af Amer: >60 mL/min (ref >60)
Glucose, Bld: 148 mg/dL — ABNORMAL HIGH (ref 70–99)
Potassium: 3.7 mmol/L (ref 3.5–5.1)
Sodium: 135 mmol/L (ref 135–145)
Total Bilirubin: 0.5 mg/dL (ref 0.3–1.2)
Total Protein: 7.4 g/dL (ref 6.5–8.1)

## 2019-05-10 LAB — CBC WITH DIFFERENTIAL/PLATELET
Abs Immature Granulocytes: 0.03 10*3/uL (ref 0.00–0.07)
Basophils Absolute: 0 10*3/uL (ref 0.0–0.1)
Basophils Relative: 0 %
Eosinophils Absolute: 0 10*3/uL (ref 0.0–0.5)
Eosinophils Relative: 0 %
HCT: 41.6 % (ref 36.0–46.0)
Hemoglobin: 13.9 g/dL (ref 12.0–15.0)
Immature Granulocytes: 0 %
Lymphocytes Relative: 10 %
Lymphs Abs: 1 10*3/uL (ref 0.7–4.0)
MCH: 30.2 pg (ref 26.0–34.0)
MCHC: 33.4 g/dL (ref 30.0–36.0)
MCV: 90.2 fL (ref 80.0–100.0)
Monocytes Absolute: 0.5 10*3/uL (ref 0.1–1.0)
Monocytes Relative: 5 %
Neutro Abs: 8.2 10*3/uL — ABNORMAL HIGH (ref 1.7–7.7)
Neutrophils Relative %: 85 %
Platelets: 382 10*3/uL (ref 150–400)
RBC: 4.61 MIL/uL (ref 3.87–5.11)
RDW: 12.3 % (ref 11.5–15.5)
WBC: 9.7 10*3/uL (ref 4.0–10.5)
nRBC: 0 % (ref 0.0–0.2)

## 2019-05-10 NOTE — ED Triage Notes (Signed)
Reported fall last week approx 3 steps on outside porch because it was wet; and has had head pain since the day after.

## 2019-05-11 ENCOUNTER — Ambulatory Visit (INDEPENDENT_AMBULATORY_CARE_PROVIDER_SITE_OTHER)
Admission: EM | Admit: 2019-05-11 | Discharge: 2019-05-11 | Disposition: A | Discharge status: Home / Self Care | Payer: Medicare Other | Source: Home / Self Care

## 2019-05-11 ENCOUNTER — Encounter (HOSPITAL_COMMUNITY): Payer: Self-pay

## 2019-05-11 DIAGNOSIS — M542 Cervicalgia: Secondary | ICD-10-CM

## 2019-05-11 DIAGNOSIS — W19XXXA Unspecified fall, initial encounter: Secondary | ICD-10-CM

## 2019-05-11 DIAGNOSIS — S139XXA Sprain of joints and ligaments of unspecified parts of neck, initial encounter: Secondary | ICD-10-CM

## 2019-05-11 MED ORDER — NAPROXEN 375 MG PO TABS: 375.0000 mg | ORAL_TABLET | Freq: Two times a day (BID) | ORAL | 1 refills | Status: DC
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
## 2019-05-11 NOTE — Discharge Instructions (Addendum)
Take the ibuprofen as prescribed.  Rest and elevate.  Apply ice packs 2-3 times a day for up to 20 minutes each.    Follow up with your primary care provider or an orthopedist if you symptoms continue or worsen;  Or if you develop new symptoms, such as numbness, tingling, or weakness.

## 2019-05-11 NOTE — ED Notes (Signed)
Pt did not answer for room. 

## 2019-05-11 NOTE — ED Triage Notes (Signed)
Pt present a fall from a week ago, she is c/o neck and foot pain.

## 2019-12-09 ENCOUNTER — Other Ambulatory Visit: Payer: Self-pay | Admitting: Family Medicine

## 2019-12-09 DIAGNOSIS — E2839 Other primary ovarian failure: Secondary | ICD-10-CM

## 2021-06-29 ENCOUNTER — Ambulatory Visit
Admission: EM | Admit: 2021-06-29 | Discharge: 2021-06-29 | Disposition: A | Discharge status: Home / Self Care | Payer: Medicare Other | Attending: Nurse Practitioner | Admitting: Nurse Practitioner

## 2021-06-29 ENCOUNTER — Encounter: Payer: Self-pay | Admitting: Emergency Medicine

## 2021-06-29 ENCOUNTER — Ambulatory Visit (INDEPENDENT_AMBULATORY_CARE_PROVIDER_SITE_OTHER): Payer: Medicare Other

## 2021-06-29 DIAGNOSIS — S52572A Other intraarticular fracture of lower end of left radius, initial encounter for closed fracture: Secondary | ICD-10-CM | POA: Diagnosis not present

## 2021-06-29 HISTORY — DX: Unspecified dementia, unspecified severity, without behavioral disturbance, psychotic disturbance, mood disturbance, and anxiety: F03.90
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
## 2021-06-29 NOTE — Discharge Instructions (Signed)
-   The x-ray today shows 2 fractures in your wrist - We have put you in a sugar tong splint today; please wear this until you follow up with Dr. Romeo Apple - You can alternate Tylenol and ibuprofen for pain - Please try to keep your arm elevated and use ice 15 minutes on, 45 minutes off to help with pain and swelling - Follow up with Dr. Romeo Apple in his office; call the number below to schedule an appointment

## 2021-06-29 NOTE — ED Triage Notes (Signed)
Larey Seat on June 4th.  Pain to left hand and wrist.

## 2022-04-07 DIAGNOSIS — F03B0 Unspecified dementia, moderate, without behavioral disturbance, psychotic disturbance, mood disturbance, and anxiety: Secondary | ICD-10-CM | POA: Diagnosis not present

## 2022-04-07 DIAGNOSIS — R413 Other amnesia: Secondary | ICD-10-CM | POA: Diagnosis not present

## 2022-04-07 DIAGNOSIS — M545 Low back pain, unspecified: Secondary | ICD-10-CM | POA: Diagnosis not present

## 2022-04-07 DIAGNOSIS — Z79899 Other long term (current) drug therapy: Secondary | ICD-10-CM | POA: Diagnosis not present

## 2022-04-07 DIAGNOSIS — R7309 Other abnormal glucose: Secondary | ICD-10-CM | POA: Diagnosis not present

## 2022-04-18 ENCOUNTER — Encounter: Payer: Self-pay | Admitting: Physician Assistant

## 2022-05-12 ENCOUNTER — Ambulatory Visit: Payer: Non-veteran care

## 2022-05-12 ENCOUNTER — Ambulatory Visit (INDEPENDENT_AMBULATORY_CARE_PROVIDER_SITE_OTHER): Payer: Non-veteran care | Admitting: Physician Assistant

## 2022-05-12 ENCOUNTER — Encounter: Payer: Self-pay | Admitting: Physician Assistant

## 2022-05-12 DIAGNOSIS — R413 Other amnesia: Secondary | ICD-10-CM
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
## 2022-05-20 DIAGNOSIS — R451 Restlessness and agitation: Secondary | ICD-10-CM | POA: Diagnosis not present

## 2022-09-19 IMAGING — DX DG WRIST COMPLETE 3+V*L*
3 series · 3 of 3 positions shown · non-contrast
Comparison: None Available.

CLINICAL DATA: Status post fall onto left wrist.

EXAM:
LEFT WRIST - COMPLETE 3+ VIEW

[wrist pa]
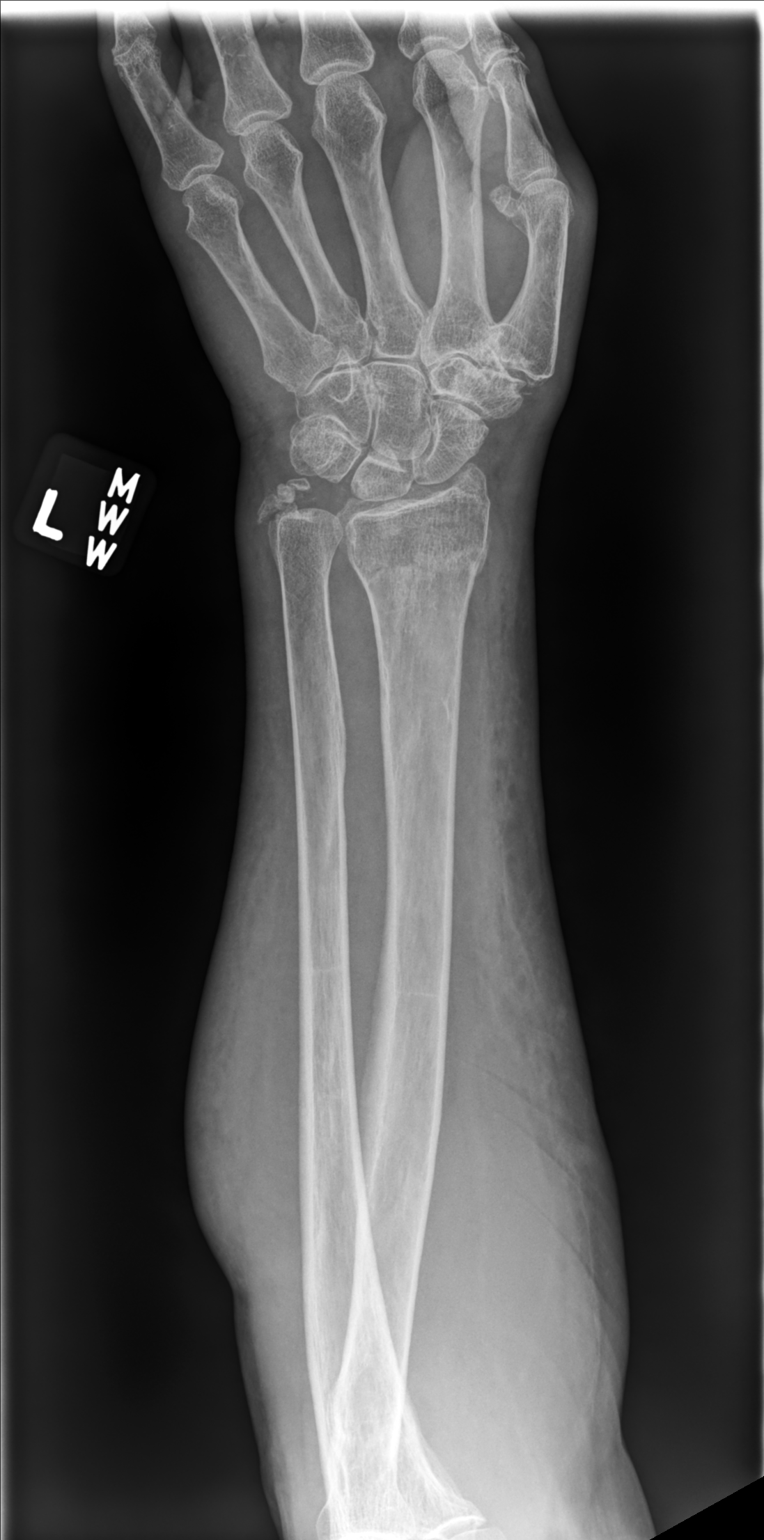

[wrist mlo]
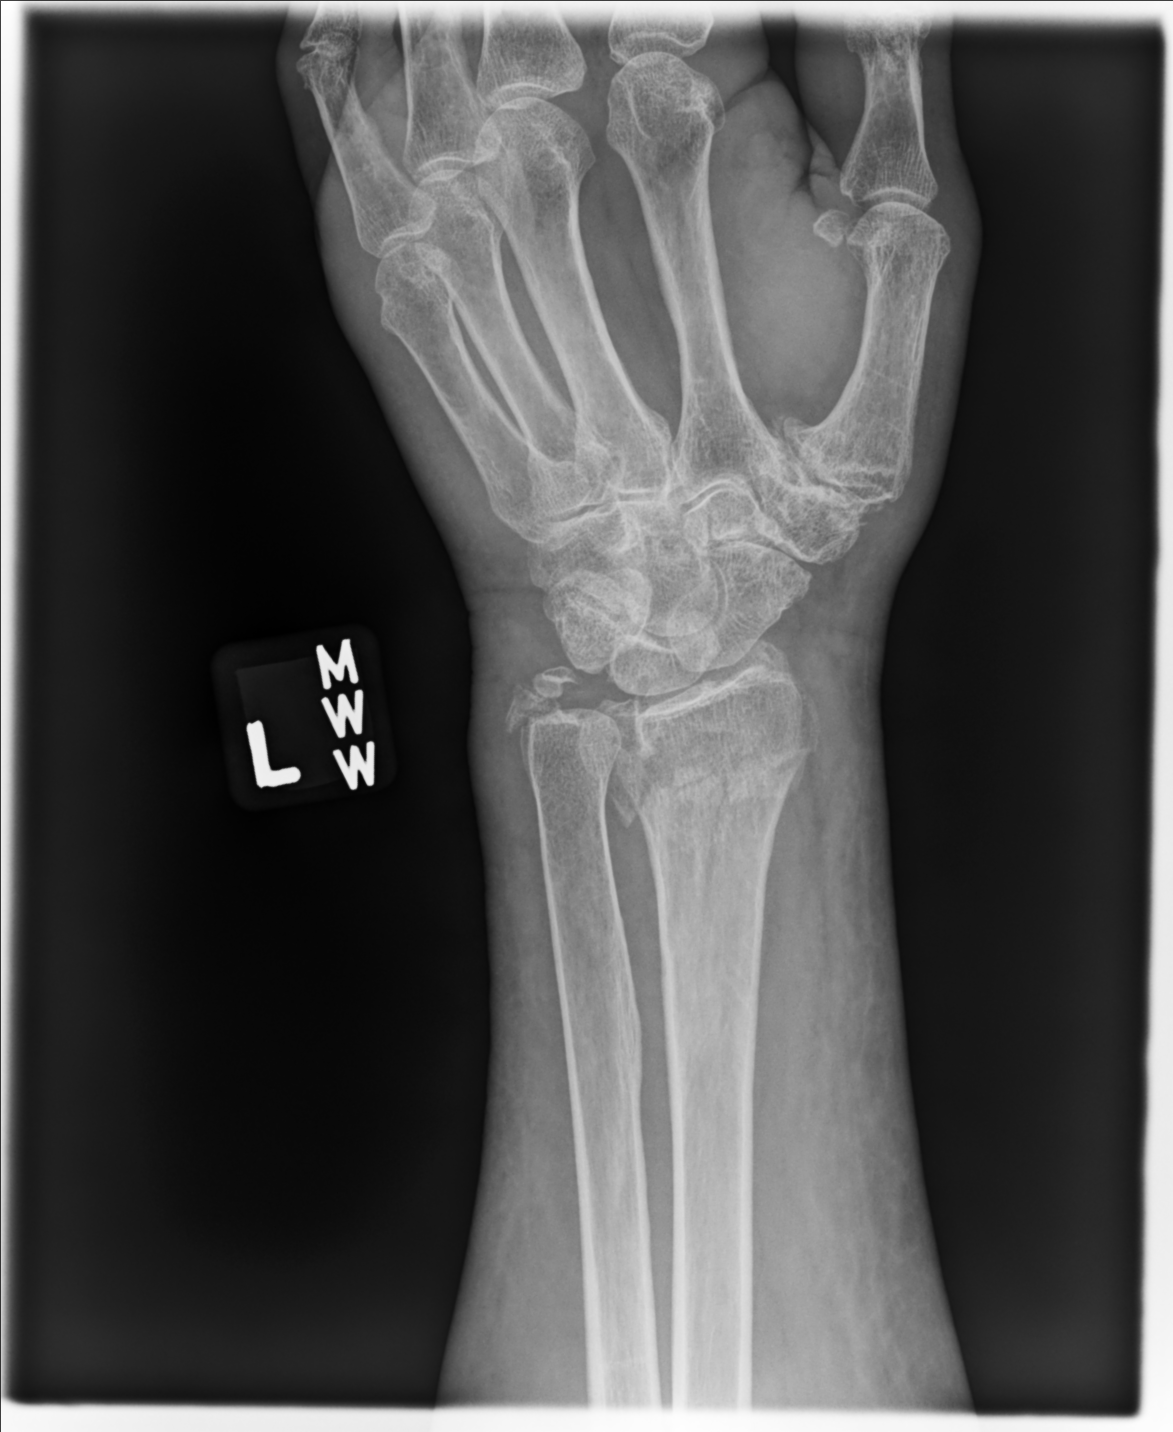

[wrist lat]
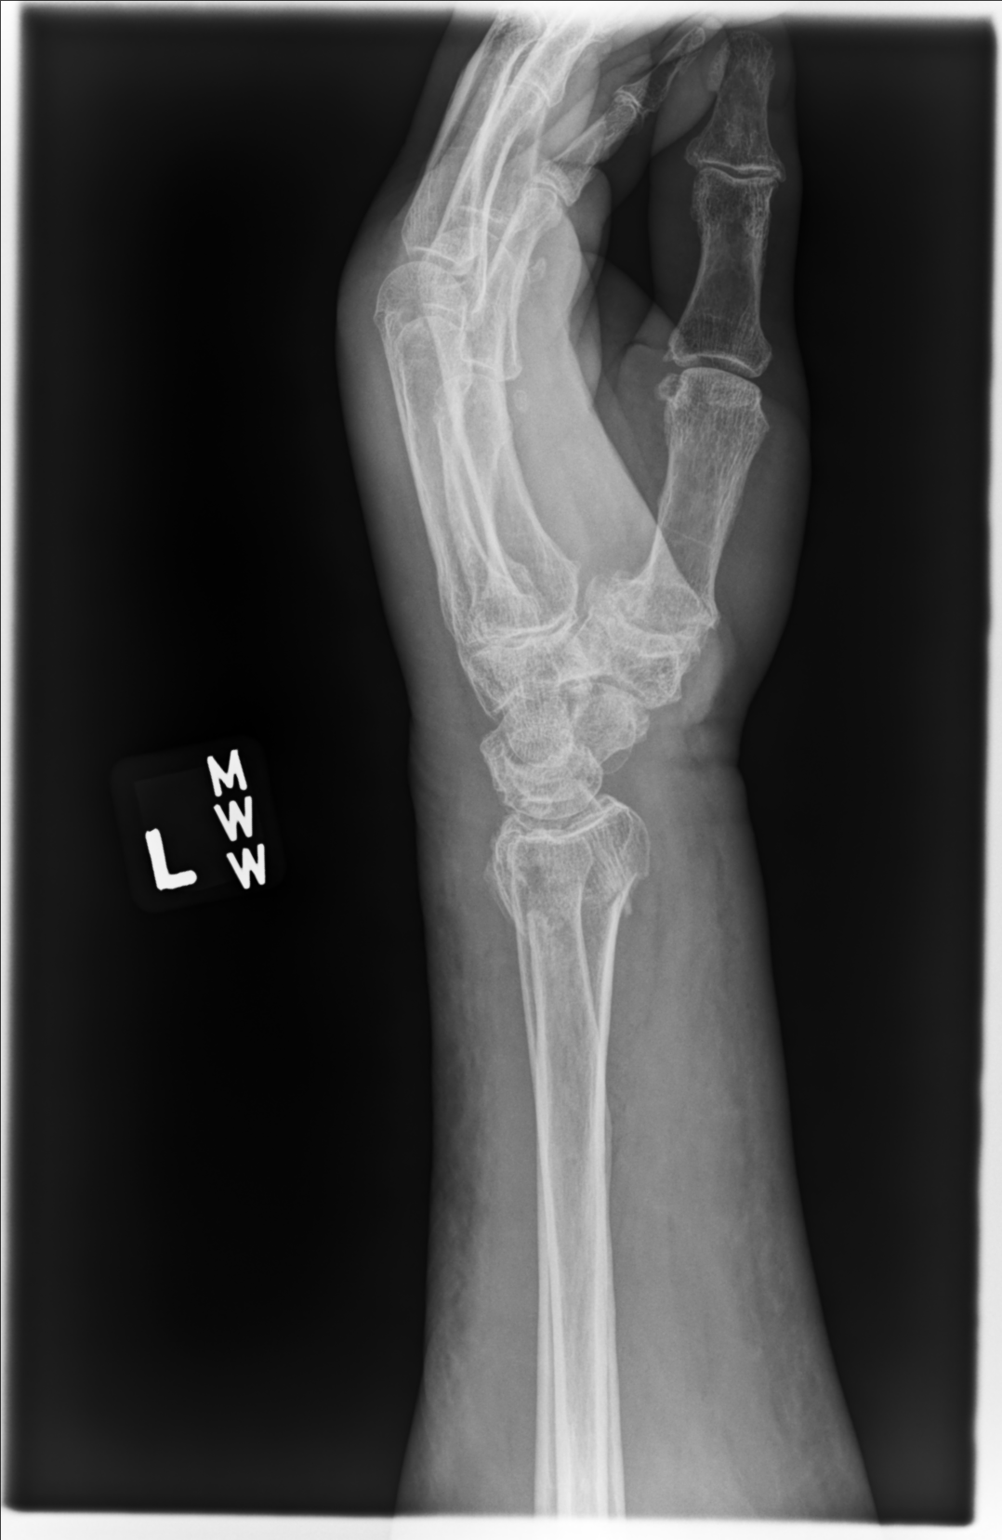

[3 of 3 positions shown; findings below may reference images not displayed]

FINDINGS: There is an acute and comminuted fracture deformity involving the
distal radius with signs impaction and intra-articular extension.
The fracture fragments appear to be in near anatomic alignment.
Signs of acute on chronic ulnar styloid fracture is also noted with
mild displacement. Degenerative changes noted at the basilar joint.
Diffuse soft tissue swelling is identified.
IMPRESSION: 1. Acute and comminuted fracture deformity involving the distal
radius with signs of impaction and intra-articular extension.
2. Acute on chronic ulnar styloid fracture.

## 2022-11-11 ENCOUNTER — Ambulatory Visit: Payer: Non-veteran care | Admitting: Physician Assistant

## 2023-11-18 ENCOUNTER — Inpatient Hospital Stay (HOSPITAL_COMMUNITY)
Admission: EM | Admit: 2023-11-18 | Discharge: 2023-11-22 | DRG: 552 | Disposition: A | Discharge status: Skilled Nursing Facility | Attending: Internal Medicine | Admitting: Internal Medicine

## 2023-11-18 ENCOUNTER — Encounter (HOSPITAL_COMMUNITY): Payer: Self-pay | Admitting: *Deleted

## 2023-11-18 ENCOUNTER — Other Ambulatory Visit: Payer: Self-pay

## 2023-11-18 ENCOUNTER — Emergency Department (HOSPITAL_COMMUNITY)

## 2023-11-18 DIAGNOSIS — W19XXXA Unspecified fall, initial encounter: Secondary | ICD-10-CM

## 2023-11-18 DIAGNOSIS — R262 Difficulty in walking, not elsewhere classified: Principal | ICD-10-CM | POA: Diagnosis present

## 2023-11-18 DIAGNOSIS — K5730 Diverticulosis of large intestine without perforation or abscess without bleeding: Secondary | ICD-10-CM | POA: Diagnosis not present

## 2023-11-18 DIAGNOSIS — F03C0 Unspecified dementia, severe, without behavioral disturbance, psychotic disturbance, mood disturbance, and anxiety: Secondary | ICD-10-CM | POA: Insufficient documentation

## 2023-11-18 DIAGNOSIS — S3210XA Unspecified fracture of sacrum, initial encounter for closed fracture: Principal | ICD-10-CM | POA: Diagnosis present

## 2023-11-18 DIAGNOSIS — I6782 Cerebral ischemia: Secondary | ICD-10-CM | POA: Diagnosis not present

## 2023-11-18 DIAGNOSIS — F05 Delirium due to known physiological condition: Secondary | ICD-10-CM | POA: Diagnosis not present

## 2023-11-18 DIAGNOSIS — Z801 Family history of malignant neoplasm of trachea, bronchus and lung: Secondary | ICD-10-CM

## 2023-11-18 DIAGNOSIS — R296 Repeated falls: Secondary | ICD-10-CM | POA: Diagnosis present

## 2023-11-18 DIAGNOSIS — R339 Retention of urine, unspecified: Secondary | ICD-10-CM | POA: Diagnosis present

## 2023-11-18 DIAGNOSIS — R531 Weakness: Secondary | ICD-10-CM | POA: Diagnosis present

## 2023-11-18 DIAGNOSIS — Z90710 Acquired absence of both cervix and uterus: Secondary | ICD-10-CM | POA: Diagnosis not present

## 2023-11-18 DIAGNOSIS — Z7401 Bed confinement status: Secondary | ICD-10-CM | POA: Diagnosis not present

## 2023-11-18 DIAGNOSIS — I10 Essential (primary) hypertension: Secondary | ICD-10-CM | POA: Diagnosis present

## 2023-11-18 DIAGNOSIS — Z66 Do not resuscitate: Secondary | ICD-10-CM | POA: Diagnosis present

## 2023-11-18 DIAGNOSIS — F0390 Unspecified dementia without behavioral disturbance: Secondary | ICD-10-CM | POA: Diagnosis present

## 2023-11-18 DIAGNOSIS — M160 Bilateral primary osteoarthritis of hip: Secondary | ICD-10-CM | POA: Diagnosis not present

## 2023-11-18 DIAGNOSIS — M25559 Pain in unspecified hip: Secondary | ICD-10-CM | POA: Diagnosis not present

## 2023-11-18 DIAGNOSIS — S199XXA Unspecified injury of neck, initial encounter: Secondary | ICD-10-CM | POA: Diagnosis not present

## 2023-11-18 LAB — CBC
HCT: 40.1 % (ref 36.0–46.0)
Hemoglobin: 13.5 g/dL (ref 12.0–15.0)
MCH: 29.5 pg (ref 26.0–34.0)
MCHC: 33.7 g/dL (ref 30.0–36.0)
MCV: 87.6 fL (ref 80.0–100.0)
Platelets: 409 K/uL — ABNORMAL HIGH (ref 150–400)
RBC: 4.58 MIL/uL (ref 3.87–5.11)
RDW: 12.8 % (ref 11.5–15.5)
WBC: 7.8 K/uL (ref 4.0–10.5)
nRBC: 0 % (ref 0.0–0.2)

## 2023-11-18 LAB — BASIC METABOLIC PANEL WITH GFR
Anion gap: 12 (ref 5–15)
BUN: 17 mg/dL (ref 8–23)
CO2: 25 mmol/L (ref 22–32)
Calcium: 9.7 mg/dL (ref 8.9–10.3)
Chloride: 102 mmol/L (ref 98–111)
Creatinine, Ser: 0.62 mg/dL (ref 0.44–1.00)
GFR, Estimated: >60 mL/min (ref >60)
Glucose, Bld: 81 mg/dL (ref 70–99)
Potassium: 3.6 mmol/L (ref 3.5–5.1)
Sodium: 139 mmol/L (ref 135–145)

## 2023-11-18 MED ORDER — ACETAMINOPHEN 650 MG RE SUPP
650.0000 mg | Freq: Four times a day (QID) | RECTAL | Status: DC | PRN
Start: 2023-11-18 — End: 2023-11-22

## 2023-11-18 MED ORDER — QUETIAPINE FUMARATE 25 MG PO TABS
25.0000 mg | ORAL_TABLET | Freq: Every day | ORAL | Status: DC
  Administered 2023-11-18 – 2023-11-21 (×4): 25 mg via ORAL
  Filled 2023-11-18 (×4): qty 1

## 2023-11-18 MED ORDER — ENOXAPARIN SODIUM 30 MG/0.3ML IJ SOSY
30.0000 mg | PREFILLED_SYRINGE | INTRAMUSCULAR | Status: DC
  Administered 2023-11-19: 30 mg via SUBCUTANEOUS
  Filled 2023-11-18: qty 0.3

## 2023-11-18 MED ORDER — POLYETHYLENE GLYCOL 3350 17 G PO PACK
17.0000 g | PACK | Freq: Every day | ORAL | Status: DC | PRN
Start: 2023-11-18 — End: 2023-11-22

## 2023-11-18 MED ORDER — HYDRALAZINE HCL 20 MG/ML IJ SOLN: 5.0000 mg | INTRAMUSCULAR | Status: DC | PRN

## 2023-11-18 MED ORDER — MORPHINE SULFATE (PF) 2 MG/ML IV SOLN: 1.0000 mg | INTRAVENOUS | Status: DC | PRN

## 2023-11-18 MED ORDER — ACETAMINOPHEN 325 MG PO TABS
325.0000 mg | ORAL_TABLET | Freq: Four times a day (QID) | ORAL | Status: DC | PRN
  Administered 2023-11-18 – 2023-11-22 (×2): 325 mg via ORAL
  Filled 2023-11-18: qty 1

## 2023-11-18 MED ORDER — TUBERCULIN PPD 5 UNIT/0.1ML ID SOLN
5.0000 [IU] | Freq: Once | INTRADERMAL | Status: DC
Start: 2023-11-18 — End: 2023-11-22
  Administered 2023-11-20: 5 [IU] via INTRADERMAL
  Filled 2023-11-18: qty 0.1

## 2023-11-18 NOTE — Progress Notes (Signed)
   11/18/23 1653  TOC Brief Assessment  Insurance and Status Reviewed  Patient has primary care physician Yes  Home environment has been reviewed FRom home, with spouse  Prior level of function: Needs assistance  Prior/Current Home Services No current home services  Social Drivers of Health Review SDOH reviewed needs interventions  Readmission risk has been reviewed Yes  Transition of care needs transition of care needs identified, TOC will continue to follow   Pt seen in error after daughter noticed bruising to face, may have fallen.  Consult from MD- pt has some injury and difficult to walk, CSW visited in room- daughter shared that pt was scheduled to go to [facility] on Tuesday of next week- she has an appointment with a PCP for an FL2 and TB test. Now pt has an injury may need skilled care in the interim. Pt admitted OBS then PT to evaluate.

## 2023-11-18 NOTE — ED Provider Notes (Signed)
 [facility] EMERGENCY DEPARTMENT AT [facility] Provider Note   CSN: [id number] Arrival date & time: 11/18/23  1222     Patient presents with: [place]   [name] is a 83 y.o. female.   Patient with history of dementia presents today with complaints of fall. Patient with advanced dementia, history provided by daughter at bedside. Patient lives at home with her husband, has 2 daughters that provide around the clock care for her at home. Reports they are looking at memory care facilities with plans to transition her there within the next week or so as she has been slowly deconditioning beyond what care the family can provide at home. Daughter reports the patient fell last Tuesday and since then has had significant difficulty ambulating to the point that now she will not walk at all which has made family unable to care for her adequately at home. Reports she is having left hip pain when she walks and when family tries to get her up to walk she screams and becomes comabtive. She has subsequently fallen a few more times since then. Prior to this she was walking unassisted. Daughter also reports she has been eating and drinking less. Patient denies any current complaints. Daughter confirms patient is at her baseline mental status.  Level 5 caveat -- dementia   The history is provided by the patient and a relative. No language interpreter was used.  Fall       Prior to Admission medications   Medication Sig Start Date End Date Taking? Authorizing Provider  Aspirin Buf,CaCarb-MgCarb-MgO, (BUFFERIN PO) Take by mouth as needed.    [provider]  Multiple Vitamin (MULTIVITAMIN) tablet Take 1 tablet by mouth daily.    [provider]  naproxen  (NAPROSYN ) 375 MG tablet Take 1 tablet (375 mg total) by mouth 2 (two) times daily with a meal. 05/11/19   Alvia Corean LITTIE, FNP  nortriptyline  (PAMELOR ) 10 MG capsule One po qhs xone week, then 2 tabs po qhs 07/20/15    Onita Duos, MD  SUMAtriptan  (IMITREX ) 25 MG tablet Take 1 tablet (25 mg total) by mouth every 2 (two) hours as needed for migraine. May repeat in 2 hours if headache persists or recurs. 07/20/15   Onita Duos, MD  vitamin C (ASCORBIC ACID) 500 MG tablet Take 500 mg by mouth daily.    [provider]  vitamin E 400 UNIT capsule Take 400 Units by mouth daily.    [provider]    Allergies: Patient has no known allergies.    Review of Systems  Unable to perform ROS: Dementia    Updated Vital Signs BP (!) 145/83 (BP Location: Right Arm)   Pulse 88   Temp 97.8 F (36.6 C) (Oral)   Resp 20   Ht 5' 6 (1.676 m)   Wt 58.1 kg   SpO2 98%   BMI 20.67 kg/m   Physical Exam Vitals and nursing note reviewed.  Constitutional:      General: She is not in acute distress.    Appearance: Normal appearance. She is normal weight. She is not ill-appearing, toxic-appearing or diaphoretic.  HENT:     Head: Normocephalic and atraumatic.     Comments: No racoon eyes No battle sign  Small hematoma noted to the left lateral jaw area. No crepitus, trismus, or deformity. Eyes:     Extraocular Movements: Extraocular movements intact.     Pupils: Pupils are equal, round, and reactive to light.  Cardiovascular:  Rate and Rhythm: Normal rate.     Comments: No tenderness to palpation of the anterior chest wall Pulmonary:     Effort: Pulmonary effort is normal. No respiratory distress.  Abdominal:     Comments: No abdominal tenderness or bruising  Musculoskeletal:        General: Normal range of motion.     Cervical back: Normal and normal range of motion.     Thoracic back: Normal.     Lumbar back: Normal.     Comments: No midline tenderness, no stepoffs or deformity noted on palpation of cervical, thoracic, and lumbar spine  Unable to actively raise her legs off the bed but will allow me to do so passively without any expression of pain. No tenderness to palpation of the  bilateral hips, knees, or legs  Skin:    General: Skin is warm and dry.  Neurological:     General: No focal deficit present.     Mental Status: She is alert and oriented to person, place, and time.  Psychiatric:        Mood and Affect: Mood normal.        Behavior: Behavior normal.     (all labs ordered are listed, but only abnormal results are displayed) Labs Reviewed  CBC - Abnormal; Notable for the following components:      Result Value   Platelets 409 (*)    All other components within normal limits  BASIC METABOLIC PANEL WITH GFR  URINALYSIS, ROUTINE W REFLEX MICROSCOPIC    EKG: None  Radiology: CT PELVIS WO CONTRAST Result Date: 11/18/2023 CLINICAL DATA:  Provided history: Hip trauma, fracture suspected, xray done EXAM: CT OF THE PELVIS WITHOUT CONTRAST TECHNIQUE: Multidetector CT imaging of the pelvis was performed according to the standard protocol. Multiplanar CT image reconstructions were also generated. RADIATION DOSE REDUCTION: This exam was performed according to the departmental dose-optimization program which includes automated exposure control, adjustment of the mA and/or kV according to patient size and/or use of iterative reconstruction technique. COMPARISON:  Hip radiograph earlier today. Lumbar spine CT earlier today FINDINGS: Bones/Joint/Cartilage Bones are subjectively under mineralized. Left lateral sacral fracture is minimally displaced, lateral to the sacral foramen. No additional pelvic fracture. The pubic rami are intact. Proximal femurs are intact. No hip dislocation. Mild bilateral hip joint space narrowing and acetabular spurring. Ligaments Suboptimally assessed by CT. Muscles and Tendons No intramuscular hematoma. Soft tissues Colonic diverticulosis without diverticulitis. No presacral hematoma. Scattered subcutaneous edema. IMPRESSION: 1. Minimally displaced left lateral sacral fracture. 2. No additional pelvic fracture. Electronically Signed   By:  Andrea Gasman M.D.   On: 11/18/2023 15:27   CT Lumbar Spine Wo Contrast Result Date: 11/18/2023 EXAM: CT OF THE LUMBAR SPINE WITHOUT CONTRAST 11/18/2023 01:23:14 PM TECHNIQUE: CT of the lumbar spine was performed without the administration of intravenous contrast. Multiplanar reformatted images are provided for review. Automated exposure control, iterative reconstruction, and/or weight based adjustment of the mA/kV was utilized to reduce the radiation dose to as low as reasonably achievable. COMPARISON: None available. CLINICAL HISTORY: Back trauma, no prior imaging (Age >= 16y). Trauma; EMS reports pt with multiple falls (3 over the past week), last one was last night. Pt with dementia, CBG 104, 122/68, HR 88 96%RA. FINDINGS: BONES AND ALIGNMENT: Diffuse osteopenia. Mild curvature of the lumbar spine appears convex towards the right. There are mild compression fractures involving the L2, L3, L4, and L5 vertebrae. Although age indeterminate, the appearance suggests chronic fractures. Most severe  fracture is at the L3 level with loss of approximately 20 percent of the vertebral body height. Left sacral wing insufficiency fracture is noted and appears acute (image 114/2). No definite acute fracture of the lumbar vertebrae or subluxation. DEGENERATIVE CHANGES: Moderate disc space narrowing is identified at the L5-S1 level. Multilevel endplate degenerative changes noted. Posterior broad based partially calcified posterior disc bulge identified at the L3-L4 level. Mild central disc bulge/herniation at L5-S1. Bilateral facet arthropathy. SOFT TISSUES: No acute abnormality. Aortic atherosclerotic calcifications. IMPRESSION: 1. Acute left sacral wing insufficiency fracture. 2. Mild multilevel lumbar vertebral compression fractures favored chronic, worst at L3 with approximately 20% height loss. 3. No definite acute lumbar fracture or subluxation. Electronically signed by: Waddell Calk MD 11/18/2023 01:50 PM EDT RP  Workstation: [id number]   DG Hip Unilat W or Wo Pelvis 2-3 Views Left Result Date: 11/18/2023 CLINICAL DATA:  Dementia patient post multiple falls. EXAM: DG HIP (WITH OR WITHOUT PELVIS) 2-3V LEFT COMPARISON:  None Available. FINDINGS: The bones are subjectively under mineralized. No evidence of acute fracture. No hip dislocation, femoral head is well seated. Mild bilateral hip osteoarthritis. Pubic rami are intact. No pubic symphyseal or sacroiliac diastasis. No focal soft tissue abnormalities. IMPRESSION: 1. No acute fracture or dislocation of the left hip. 2. Mild bilateral hip osteoarthritis. Electronically Signed   By: Andrea Gasman M.D.   On: 11/18/2023 13:48   CT Cervical Spine Wo Contrast Result Date: 11/18/2023 CLINICAL DATA:  Neck trauma.  Dementia patient post multiple falls. EXAM: CT CERVICAL SPINE WITHOUT CONTRAST TECHNIQUE: Multidetector CT imaging of the cervical spine was performed without intravenous contrast. Multiplanar CT image reconstructions were also generated. RADIATION DOSE REDUCTION: This exam was performed according to the departmental dose-optimization program which includes automated exposure control, adjustment of the mA and/or kV according to patient size and/or use of iterative reconstruction technique. COMPARISON:  CT cervical spine 05/10/2019 FINDINGS: Alignment: No traumatic subluxation. Trace grade 1 anterolisthesis of C2 on C3, likely facet mediated. Skull base and vertebrae: No acute fracture. Vertebral body heights are maintained. The dens and skull base are intact. Anterior fusion C5-C6 with intact hardware. Soft tissues and spinal canal: No prevertebral fluid or swelling. No visible canal hematoma. Disc levels: Peripherally calcified disc osteophyte complex at C3-C4 and C4-C5 causes minimal mass effect on the spinal canal. Multilevel facet hypertrophy. Upper chest: No acute findings. Other: None. IMPRESSION: 1. No acute fracture or traumatic subluxation of the  cervical spine. 2. Anterior fusion C5-C6 with intact hardware. 3. Multilevel degenerative disc disease and facet hypertrophy. Electronically Signed   By: Andrea Gasman M.D.   On: 11/18/2023 13:47   CT Head Wo Contrast Result Date: 11/18/2023 CLINICAL DATA:  Dementia patient post multiple falls. EXAM: CT HEAD WITHOUT CONTRAST TECHNIQUE: Contiguous axial images were obtained from the base of the skull through the vertex without intravenous contrast. RADIATION DOSE REDUCTION: This exam was performed according to the departmental dose-optimization program which includes automated exposure control, adjustment of the mA and/or kV according to patient size and/or use of iterative reconstruction technique. COMPARISON:  Head CT 05/10/2019 FINDINGS: Brain: Generalized atrophy with mild progression from prior. Periventricular and deep white matter hypodensity typical of chronic small vessel ischemia, also progressed. No intracranial hemorrhage, mass effect, or midline shift. No hydrocephalus. The basilar cisterns are patent. No evidence of territorial infarct or acute ischemia. No extra-axial or intracranial fluid collection. Vascular: Atherosclerosis of skullbase vasculature without hyperdense vessel or abnormal calcification. Skull: No fracture or focal lesion. Sinuses/Orbits:  Paranasal sinuses and mastoid air cells are clear. The visualized orbits are unremarkable. Other: No confluent scalp hematoma. IMPRESSION: 1. No acute intracranial abnormality. No skull fracture. 2. Generalized atrophy and chronic small vessel ischemia, mildly progressed from 2021 exam. Electronically Signed   By: Andrea Gasman M.D.   On: 11/18/2023 13:40     Procedures   Medications Ordered in the ED - No data to display                                  Medical Decision Making Amount and/or Complexity of Data Reviewed Labs: ordered. Radiology: ordered.   This patient is a 83 y.o. female who presents to the ED for concern of  fall, this involves an extensive number of treatment options, and is a complaint that carries with it a high risk of complications and morbidity. The emergent differential diagnosis prior to evaluation includes, but is not limited to,  trauma, dehydration, electrolyte derangement, generalized deconditioning, worsening dementia. This is not an exhaustive differential.   Past Medical History / Co-morbidities / Social History:  has a past medical history of Dementia (HCC), Head injury, Headache, and Memory loss.  Additional history: Chart reviewed.  Physical Exam: Physical exam performed. The pertinent findings include:   Unable to actively raise her legs off the bed but will allow me to do so passively without any expression of pain. No tenderness to palpation of the bilateral hips, knees, or legs  Lab Tests: I ordered, and personally interpreted labs.  The pertinent results include:  no acute laboratory abnormalities   Imaging Studies: I ordered imaging studies including CT head, cervical spine, lumbar spine, pelvis, Dg left hip. I independently visualized and interpreted imaging which showed   CT head:  1. No acute intracranial abnormality. No skull fracture. 2. Generalized atrophy and chronic small vessel ischemia, mildly progressed from 2021 exam.  CT cervical spine:  1. No acute fracture or traumatic subluxation of the cervical spine. 2. Anterior fusion C5-C6 with intact hardware. 3. Multilevel degenerative disc disease and facet hypertrophy.  CT lumbar spine:  1. Acute left sacral wing insufficiency fracture. 2. Mild multilevel lumbar vertebral compression fractures favored chronic, worst at L3 with approximately 20% height loss. 3. No definite acute lumbar fracture or subluxation.  CT pelvis:  1. Minimally displaced left lateral sacral fracture. 2. No additional pelvic fracture.  DG left hip: 1. No acute fracture or dislocation of the left hip. 2. Mild bilateral hip  osteoarthritis.  I agree with the radiologist interpretation.  Consultations Obtained: I requested consultation with the orthopedics on call Dr. Sharl,  and discussed lab and imaging findings as well as pertinent plan - they recommend: weight bearing as tolerated, no TLSO is needed, follow-up is only as needed   Disposition: After consideration of the diagnostic results and the patients response to treatment, I feel that patient will require admission, PT/OT, likely SNF placement. Given that she comes from home and newly cannot walk presumably due to pain from her sacral fracture, family at bedside reports they cannot care for her if she cannot get up, will need admission.   Discussed patient with hospitalist Dr. Sherlon who accepts patient for admission.   Final diagnoses:  Closed fracture of sacrum, unspecified portion of sacrum, initial encounter (HCC)  Fall, initial encounter    ED Discharge Orders     None  [name] 11/18/23 ARDELLA Melvenia Motto, MD 11/19/23 [phone]

## 2023-11-18 NOTE — Plan of Care (Signed)

## 2023-11-18 NOTE — H&P (Addendum)
 [facility] H&P   Patient Demographics:    [name], is a 83 y.o. female  MRN: [id number]   DOB - [birth year]  Admit Date - 11/18/2023  Outpatient Primary MD for the patient is Chrystal Lamarr RAMAN, MD  Referring MD/NP/PA: PA Smoot  Patient coming from: Home  Chief Complaint  Patient presents with   Fall      HPI:    [name]  is a 83 y.o. female, past medical history of advanced dementia, progressive over last few years, family unclear what type of dementia, but it does appear to be sporadic dementia given rapid advancement and hallucinations. - Patient with multiple falls over the last week, unsteady gait, with her husband, who is frail himself, but 2 daughters able to provide care around-the-clock, and they were looking for care facilities, and on Monday there was plan to provide TB test and referral to for placement, around 3 times last week, most recent on Tuesday, since then she has been having difficult time to ambulate, stand up, planing of left lower back pain, which prompted family to bring her to ED. - In ED no significant lab abnormalities, CT pelvis significant for mildly displaced left sacral fracture, she was unable to stand up or ambulate, so [facility] hospitalist were requested to admit.    Review of systems:    Patient unable to provide reliable review of systems  With Past History of the following :    Past Medical History:  Diagnosis Date   Dementia (HCC)    Head injury    Headache    Memory loss       Past Surgical History:  Procedure Laterality Date   ABDOMINAL HYSTERECTOMY     BURN TREATMENT     HAND SURGERY     NECK SURGERY        Social History:     Social History   Tobacco Use   Smoking status: Never   Smokeless tobacco: Never   Tobacco comments:    Quit [id number]  Substance Use Topics   Alcohol use: No    Alcohol/week: 0.0  standard drinks of alcohol      Family History :     Family History  Problem Relation Age of Onset   Pneumonia Father    Lung cancer Mother       Home Medications:   Prior to Admission medications   Not on File     Allergies:    No Known Allergies   Physical Exam:   Vitals  Blood pressure (!) 156/90, pulse 73, temperature 97.9 F (36.6 C), temperature source Oral, resp. rate 20, height 5' 6 (1.676 m), weight 58.1 kg, SpO2 98%.   1. General Appearing female, laying in bed, no apparent distress  2.  Advanced dementia, impaired judgment and insight, oriented x 1   3. No F.N deficits, ALL C.Nerves Intact, Strength 5/5  all 4 extremities, Sensation intact all 4 extremities, Plantars down going.  4. Ears and Eyes appear Normal, Conjunctivae clear, PERRLA. Moist Oral Mucosa.  5. Supple Neck, No JVD, No cervical lymphadenopathy appriciated, No Carotid Bruits.  6. Symmetrical Chest wall movement, Good air movement bilaterally, CTAB.  7. RRR, No Gallops, Rubs or Murmurs, No Parasternal Heave.  8. Positive Bowel Sounds, Abdomen Soft, No tenderness, No organomegaly appriciated,No rebound -guarding or rigidity.  9.  No Cyanosis, Normal Skin Turgor, No Skin Rash or Bruise.  10. Good muscle tone,  joints appear normal , significant tenderness to palpation in the left lower back    Data Review:    CBC Recent Labs  Lab 11/18/23 1309  WBC 7.8  HGB 13.5  HCT 40.1  PLT 409*  MCV 87.6  MCH 29.5  MCHC 33.7  RDW 12.8   ------------------------------------------------------------------------------------------------------------------  Chemistries  Recent Labs  Lab 11/18/23 1309  NA 139  K 3.6  CL 102  CO2 25  GLUCOSE 81  BUN 17  CREATININE 0.62  CALCIUM 9.7   ------------------------------------------------------------------------------------------------------------------ estimated creatinine clearance is 49.7 mL/min (by C-G formula based on SCr of 0.62  mg/dL). ------------------------------------------------------------------------------------------------------------------ No results for input(s): TSH, T4TOTAL, T3FREE, THYROIDAB in the last 72 hours.  Invalid input(s): FREET3  Coagulation profile No results for input(s): INR, PROTIME in the last 168 hours. ------------------------------------------------------------------------------------------------------------------- No results for input(s): DDIMER in the last 72 hours. -------------------------------------------------------------------------------------------------------------------  Cardiac Enzymes No results for input(s): CKMB, TROPONINI, MYOGLOBIN in the last 168 hours.  Invalid input(s): CK ------------------------------------------------------------------------------------------------------------------ No results found for: BNP   ---------------------------------------------------------------------------------------------------------------  Urinalysis No results found for: COLORURINE, APPEARANCEUR, LABSPEC, PHURINE, GLUCOSEU, HGBUR, BILIRUBINUR, KETONESUR, PROTEINUR, UROBILINOGEN, NITRITE, LEUKOCYTESUR  ----------------------------------------------------------------------------------------------------------------   Imaging Results:    CT PELVIS WO CONTRAST Result Date: 11/18/2023 CLINICAL DATA:  Provided history: Hip trauma, fracture suspected, xray done EXAM: CT OF THE PELVIS WITHOUT CONTRAST TECHNIQUE: Multidetector CT imaging of the pelvis was performed according to the standard protocol. Multiplanar CT image reconstructions were also generated. RADIATION DOSE REDUCTION: This exam was performed according to the departmental dose-optimization program which includes automated exposure control, adjustment of the mA and/or kV according to patient size and/or use of iterative reconstruction technique. COMPARISON:  Hip  radiograph earlier today. Lumbar spine CT earlier today FINDINGS: Bones/Joint/Cartilage Bones are subjectively under mineralized. Left lateral sacral fracture is minimally displaced, lateral to the sacral foramen. No additional pelvic fracture. The pubic rami are intact. Proximal femurs are intact. No hip dislocation. Mild bilateral hip joint space narrowing and acetabular spurring. Ligaments Suboptimally assessed by CT. Muscles and Tendons No intramuscular hematoma. Soft tissues Colonic diverticulosis without diverticulitis. No presacral hematoma. Scattered subcutaneous edema. IMPRESSION: 1. Minimally displaced left lateral sacral fracture. 2. No additional pelvic fracture. Electronically Signed   By: Andrea Gasman M.D.   On: 11/18/2023 15:27   CT Lumbar Spine Wo Contrast Result Date: 11/18/2023 EXAM: CT OF THE LUMBAR SPINE WITHOUT CONTRAST 11/18/2023 01:23:14 PM TECHNIQUE: CT of the lumbar spine was performed without the administration of intravenous contrast. Multiplanar reformatted images are provided for review. Automated exposure control, iterative reconstruction, and/or weight based adjustment of the mA/kV was utilized to reduce the radiation dose to as low as reasonably achievable. COMPARISON: None available. CLINICAL HISTORY: Back trauma, no prior imaging (Age >= 16y). Trauma; EMS reports pt with multiple falls (3 over the past week), last one was last night. Pt with dementia, CBG 104, 122/68, HR 88 96%RA. FINDINGS: BONES AND ALIGNMENT: Diffuse osteopenia. Mild curvature  of the lumbar spine appears convex towards the right. There are mild compression fractures involving the L2, L3, L4, and L5 vertebrae. Although age indeterminate, the appearance suggests chronic fractures. Most severe fracture is at the L3 level with loss of approximately 20 percent of the vertebral body height. Left sacral wing insufficiency fracture is noted and appears acute (image 114/2). No definite acute fracture of the lumbar  vertebrae or subluxation. DEGENERATIVE CHANGES: Moderate disc space narrowing is identified at the L5-S1 level. Multilevel endplate degenerative changes noted. Posterior broad based partially calcified posterior disc bulge identified at the L3-L4 level. Mild central disc bulge/herniation at L5-S1. Bilateral facet arthropathy. SOFT TISSUES: No acute abnormality. Aortic atherosclerotic calcifications. IMPRESSION: 1. Acute left sacral wing insufficiency fracture. 2. Mild multilevel lumbar vertebral compression fractures favored chronic, worst at L3 with approximately 20% height loss. 3. No definite acute lumbar fracture or subluxation. Electronically signed by: Waddell Calk MD 11/18/2023 01:50 PM EDT RP Workstation: [id number]   DG Hip Unilat W or Wo Pelvis 2-3 Views Left Result Date: 11/18/2023 CLINICAL DATA:  Dementia patient post multiple falls. EXAM: DG HIP (WITH OR WITHOUT PELVIS) 2-3V LEFT COMPARISON:  None Available. FINDINGS: The bones are subjectively under mineralized. No evidence of acute fracture. No hip dislocation, femoral head is well seated. Mild bilateral hip osteoarthritis. Pubic rami are intact. No pubic symphyseal or sacroiliac diastasis. No focal soft tissue abnormalities. IMPRESSION: 1. No acute fracture or dislocation of the left hip. 2. Mild bilateral hip osteoarthritis. Electronically Signed   By: Andrea Gasman M.D.   On: 11/18/2023 13:48   CT Cervical Spine Wo Contrast Result Date: 11/18/2023 CLINICAL DATA:  Neck trauma.  Dementia patient post multiple falls. EXAM: CT CERVICAL SPINE WITHOUT CONTRAST TECHNIQUE: Multidetector CT imaging of the cervical spine was performed without intravenous contrast. Multiplanar CT image reconstructions were also generated. RADIATION DOSE REDUCTION: This exam was performed according to the departmental dose-optimization program which includes automated exposure control, adjustment of the mA and/or kV according to patient size and/or use of iterative  reconstruction technique. COMPARISON:  CT cervical spine 05/10/2019 FINDINGS: Alignment: No traumatic subluxation. Trace grade 1 anterolisthesis of C2 on C3, likely facet mediated. Skull base and vertebrae: No acute fracture. Vertebral body heights are maintained. The dens and skull base are intact. Anterior fusion C5-C6 with intact hardware. Soft tissues and spinal canal: No prevertebral fluid or swelling. No visible canal hematoma. Disc levels: Peripherally calcified disc osteophyte complex at C3-C4 and C4-C5 causes minimal mass effect on the spinal canal. Multilevel facet hypertrophy. Upper chest: No acute findings. Other: None. IMPRESSION: 1. No acute fracture or traumatic subluxation of the cervical spine. 2. Anterior fusion C5-C6 with intact hardware. 3. Multilevel degenerative disc disease and facet hypertrophy. Electronically Signed   By: Andrea Gasman M.D.   On: 11/18/2023 13:47   CT Head Wo Contrast Result Date: 11/18/2023 CLINICAL DATA:  Dementia patient post multiple falls. EXAM: CT HEAD WITHOUT CONTRAST TECHNIQUE: Contiguous axial images were obtained from the base of the skull through the vertex without intravenous contrast. RADIATION DOSE REDUCTION: This exam was performed according to the departmental dose-optimization program which includes automated exposure control, adjustment of the mA and/or kV according to patient size and/or use of iterative reconstruction technique. COMPARISON:  Head CT 05/10/2019 FINDINGS: Brain: Generalized atrophy with mild progression from prior. Periventricular and deep white matter hypodensity typical of chronic small vessel ischemia, also progressed. No intracranial hemorrhage, mass effect, or midline shift. No hydrocephalus. The basilar cisterns are patent.  No evidence of territorial infarct or acute ischemia. No extra-axial or intracranial fluid collection. Vascular: Atherosclerosis of skullbase vasculature without hyperdense vessel or abnormal calcification.  Skull: No fracture or focal lesion. Sinuses/Orbits: Paranasal sinuses and mastoid air cells are clear. The visualized orbits are unremarkable. Other: No confluent scalp hematoma. IMPRESSION: 1. No acute intracranial abnormality. No skull fracture. 2. Generalized atrophy and chronic small vessel ischemia, mildly progressed from 2021 exam. Electronically Signed   By: Andrea Gasman M.D.   On: 11/18/2023 13:40      Assessment & Plan:    Principal Problem:   Ambulatory dysfunction Active Problems:   Advanced dementia (HCC)    Ambulatory dysfunction Mild nondisplaced left sacral fracture -Was unable to ambulate due to unsteady gait and uncontrolled pain. - Will consult PT/OT. - Likely will need placement, family with arranging for placement at memory care unit, FL 2 and TB test was pending for placement, will request TB to be done today, TOC consult placed to assist with placement unclear she will need memory care or higher level yet pending PT evaluation. - Keep on as needed pain regimen - ED Discussed with Ortho on-call Dr. Sharl, recommendations to bear as tolerated, no TLSO indicated,  follow-up would only be as needed .   Advanced dementia - Patient with history of advanced dementia, totally dependent on all forms of ADL. - Discussed with daughter, high risk for hospital delirium. - Will start on low-dose Seroquel at night  DVT Prophylaxis   Lovenox   AM Labs Ordered, also please review Full Orders  Family Communication: Admission, patients condition and plan of care including tests being ordered have been discussed with the patient and daughter at bedside  who indicate understanding and agree with the plan and Code Status.  Code Status DNR please see pictures of [facility] in media section.  Likely DC to  SNF  Consults called: none    Admission status: observation    Time spent in minutes : 55 minutes   Brayton Lye M.D on 11/18/2023 at 5:02 PM   [facility] - Office  [phone]

## 2023-11-18 NOTE — ED Triage Notes (Signed)
 EMS reports pt with multiple falls (3 over the past week), last one was last night. Pt with dementia, CBG 104, 122/68, HR 88 96%RA

## 2023-11-19 DIAGNOSIS — F03C0 Unspecified dementia, severe, without behavioral disturbance, psychotic disturbance, mood disturbance, and anxiety: Secondary | ICD-10-CM | POA: Diagnosis not present

## 2023-11-19 DIAGNOSIS — R296 Repeated falls: Secondary | ICD-10-CM

## 2023-11-19 MED ORDER — ENOXAPARIN SODIUM 40 MG/0.4ML IJ SOSY
40.0000 mg | PREFILLED_SYRINGE | INTRAMUSCULAR | Status: DC
  Administered 2023-11-20 – 2023-11-22 (×3): 40 mg via SUBCUTANEOUS
  Filled 2023-11-19 (×3): qty 0.4

## 2023-11-19 MED ORDER — AMLODIPINE BESYLATE 5 MG PO TABS
5.0000 mg | ORAL_TABLET | Freq: Every day | ORAL | Status: DC
  Administered 2023-11-19 – 2023-11-22 (×3): 5 mg via ORAL
  Filled 2023-11-19 (×4): qty 1

## 2023-11-19 MED ORDER — HALOPERIDOL LACTATE 5 MG/ML IJ SOLN
1.0000 mg | Freq: Four times a day (QID) | INTRAMUSCULAR | Status: DC | PRN
  Administered 2023-11-19 – 2023-11-21 (×3): 1 mg via INTRAMUSCULAR
  Filled 2023-11-19 (×3): qty 1

## 2023-11-19 NOTE — Plan of Care (Signed)
  Problem: Acute Rehab PT Goals(only PT should resolve) Goal: Pt Will Go Supine/Side To Sit Outcome: Progressing Flowsheets (Taken 11/19/2023 1219) Pt will go Supine/Side to Sit: with minimal assist Goal: Patient Will Transfer Sit To/From Stand Outcome: Progressing Flowsheets (Taken 11/19/2023 1219) Patient will transfer sit to/from stand: with minimal assist Goal: Pt Will Transfer Bed To Chair/Chair To Bed Outcome: Progressing Flowsheets (Taken 11/19/2023 1219) Pt will Transfer Bed to Chair/Chair to Bed: with min assist Goal: Pt Will Ambulate Outcome: Progressing Flowsheets (Taken 11/19/2023 1219) Pt will Ambulate:  with least restrictive assistive device  10 feet  with minimal assist    12:19 PM, 11/19/23 Forrest Jaroszewski Powell-Butler, PT, DPT [place] with [facility]

## 2023-11-19 NOTE — Evaluation (Signed)
 Physical Therapy Evaluation Patient Details Name: [name] MRN: [id number] DOB: July 22, 1940 Today's Date: 11/19/2023  History of Present Illness  [name]  is a 83 y.o. female, past medical history of advanced dementia, progressive over last few years, family unclear what type of dementia, but it does appear to be sporadic dementia given rapid advancement and hallucinations.  - Patient with multiple falls over the last week, unsteady gait, with her husband, who is frail himself, but 2 daughters able to provide care around-the-clock, and they were looking for care facilities, and on Monday there was plan to provide TB test and referral to for placement, around 3 times last week, most recent on Tuesday, since then she has been having difficult time to ambulate, stand up, planing of left lower back pain, which prompted family to bring her to ED.  - In ED no significant lab abnormalities, CT pelvis significant for mildly displaced left sacral fracture, she was unable to stand up or ambulate, so [facility] hospitalist were requested to admit.   Clinical Impression  Patient agreeable to PT evaluation. Pt very pleasant but unable to obtain any other history from patient due to cognitive level. No family or friends present during session. Per H&P and SW note, pt daughters may assist with ADLs at baseline and pt may have previously been ambulatory at household level but recent decline with pt unable to stand or ambulate, recent falls as well. This date, pt requires moderate assist with bed mobility, functional transfers, and very limited ambulation. Pt reports no pain throughout but demonstrates signs of pain with dec weight onto LLE, and groaning. Pt returned to bed at end of session, call button within reach and bed alarm set. Patient will benefit from continued skilled physical therapy acutely and in recommended venue in order to address current deficits to improve function .      If plan is discharge  home, recommend the following: A lot of help with walking and/or transfers;A lot of help with bathing/dressing/bathroom;Assist for transportation;Supervision due to cognitive status;Assistance with cooking/housework   Can travel by private vehicle        Equipment Recommendations    Recommendations for Other Services       Functional Status Assessment Patient has had a recent decline in their functional status and demonstrates the ability to make significant improvements in function in a reasonable and predictable amount of time.     Precautions / Restrictions Precautions Precautions: Fall Recall of Precautions/Restrictions: Impaired Restrictions Weight Bearing Restrictions Per Provider Order: No      Mobility  Bed Mobility Overal bed mobility: Needs Assistance Bed Mobility: Supine to Sit, Sit to Supine     Supine to sit: Mod assist Sit to supine: Mod assist   General bed mobility comments: HOB flattened, used simple and short commands, pt can initiate transfer with verbal command to get LE to EOB but requires assist at trunk to complete.    Transfers Overall transfer level: Needs assistance Equipment used: 1 person hand held assist Transfers: Sit to/from Stand Sit to Stand: Mod assist           General transfer comment: used simple and short commands, no AD used, used gait belt, got close to pt, pt hands on PT forearms, mod assist to stand due to weakness and possible pain. 2 STS in session    Ambulation/Gait Ambulation/Gait assistance: Mod assist Gait Distance (Feet): 3 Feet Assistive device: 1 person hand held assist Gait Pattern/deviations: Step-to pattern, Decreased weight  shift to left, Decreased step length - right, Decreased step length - left, Decreased stance time - left, Antalgic Gait velocity: Dec     General Gait Details: Limited to a few side/forward steps at bedside with moderate assist, noticeable dec weight shift/stance time on LLE with signs of  pain  Stairs            Wheelchair Mobility     Tilt Bed    Modified Rankin (Stroke Patients Only)       Balance Overall balance assessment: Needs assistance Sitting-balance support: Feet supported, No upper extremity supported, Bilateral upper extremity supported Sitting balance-Leahy Scale: Fair Sitting balance - Comments: seated EOB   Standing balance support: During functional activity, Bilateral upper extremity supported Standing balance-Leahy Scale: Poor Standing balance comment: w/ support of PT         Pertinent Vitals/Pain Pain Assessment Pain Assessment: Faces Faces Pain Scale: Hurts even more Pain Location: unable to verbally express pain but groans and moans during mobility Pain Descriptors / Indicators: Moaning Pain Intervention(s): Limited activity within patient's tolerance    Home Living Family/patient expects to be discharged to:: Private residence Living Arrangements: Spouse/significant other Available Help at Discharge: Family;Available 24 hours/day               Additional Comments: unable to get any further information that was what provided in H&P and SW note due to pt advanced dementia (responds I dont know to a lot of questions) No family present during evaluation.    Prior Function Prior Level of Function : Patient poor historian/Family not available             Mobility Comments: per H&P and SW note, pt has had some recent falls and having inc trouble with standing and ambulation. ADLs Comments: per H&P and SW note, pt has 2 daughters that provide care around the clock     Extremity/Trunk Assessment   Upper Extremity Assessment Upper Extremity Assessment: Defer to OT evaluation    Lower Extremity Assessment Lower Extremity Assessment: Generalized weakness (unable to formally test LE MMT due to inconsistency with commands but pt generally weak throughout, demo pain signs with mobility, needs assist during transfers)     Cervical / Trunk Assessment Cervical / Trunk Assessment: Kyphotic  Communication   Communication Communication: No apparent difficulties    Cognition Arousal: Lethargic Behavior During Therapy: WFL for tasks assessed/performed   PT - Cognitive impairments: History of cognitive impairments, No family/caregiver present to determine baseline       PT - Cognition Comments: Alert to self only Following commands: Impaired Following commands impaired: Follows one step commands inconsistently     Cueing Cueing Techniques: Verbal cues, Tactile cues, Gestural cues, Visual cues     General Comments      Exercises     Assessment/Plan    PT Assessment Patient needs continued PT services;All further PT needs can be met in the next venue of care  PT Problem List Decreased strength;Decreased range of motion;Decreased activity tolerance;Decreased balance;Decreased mobility;Decreased cognition;Decreased safety awareness;Pain;Decreased knowledge of precautions;Decreased knowledge of use of DME       PT Treatment Interventions DME instruction;Gait training;Stair training;Functional mobility training;Therapeutic activities;Therapeutic exercise;Balance training;Patient/family education    PT Goals (Current goals can be found in the Care Plan section)  Acute Rehab PT Goals Patient Stated Goal: return home PT Goal Formulation: With patient Time For Goal Achievement: 12/04/23 Potential to Achieve Goals: Good    Frequency Min 3X/week  Co-evaluation               AM-PAC PT 6 Clicks Mobility  Outcome Measure Help needed turning from your back to your side while in a flat bed without using bedrails?: A Little Help needed moving from lying on your back to sitting on the side of a flat bed without using bedrails?: A Little Help needed moving to and from a bed to a chair (including a wheelchair)?: A Lot Help needed standing up from a chair using your arms (e.g., wheelchair or  bedside chair)?: A Lot Help needed to walk in hospital room?: A Lot Help needed climbing 3-5 steps with a railing? : A Lot 6 Click Score: 14    End of Session Equipment Utilized During Treatment: Gait belt Activity Tolerance: Patient tolerated treatment well;Patient limited by pain Patient left: in bed;with call bell/phone within reach;with bed alarm set   PT Visit Diagnosis: Unsteadiness on feet (R26.81);Other abnormalities of gait and mobility (R26.89);Muscle weakness (generalized) (M62.81);History of falling (Z91.81);Difficulty in walking, not elsewhere classified (R26.2);Pain Pain - Right/Left: Left Pain - part of body: Hip    Time: 0957-1010 PT Time Calculation (min) (ACUTE ONLY): 13 min   Charges:   PT Evaluation $PT Eval Low Complexity: 1 Low   PT General Charges $$ ACUTE PT VISIT: 1 Visit         12:16 PM, 11/19/23 Adiba Fargnoli Powell-Butler, PT, DPT [place] with [facility]

## 2023-11-19 NOTE — Care Management Obs Status (Signed)
 MEDICARE OBSERVATION STATUS NOTIFICATION   Patient Details  Name: [name] MRN: [id number] Date of Birth: 1940/06/06   Medicare Observation Status Notification Given:  Yes    Hoy Diana Bigness, LCSW 11/19/2023, 11:41 AM

## 2023-11-19 NOTE — TOC Progression Note (Signed)
 Transition of Care [facility]) - Progression Note    Patient Details  Name: [name] MRN: [id number] Date of Birth: [birth year]  Transition of Care [facility]) CM/SW Contact  Hoy DELENA Bigness, LCSW Phone Number: 11/19/2023, 12:28 PM  Clinical Narrative:    Pt's daughter, Ronal Caldron, agreeable to SNF recommendation. She would like for pt to go to [facility] or to a facility in [place] as this is where she resides. Referrals have been sent out and currently awaiting bed offers.      Barriers to Discharge: Continued Medical Work up               Expected Discharge Plan and Services                                               Social Drivers of Health (SDOH) Interventions SDOH Screenings   Food Insecurity: Patient Unable To Answer (11/18/2023)  Housing: Unknown (11/18/2023)  Transportation Needs: Patient Unable To Answer (11/18/2023)  Utilities: Patient Unable To Answer (11/18/2023)  Social Connections: Patient Unable To Answer (11/18/2023)  Tobacco Use: Low Risk  (11/18/2023)    Readmission Risk Interventions     No data to display

## 2023-11-19 NOTE — Hospital Course (Signed)
 83 y.o. female, past medical history of advanced dementia, progressive over last few years, family unclear what type of dementia, but it does appear to be sporadic dementia given rapid advancement and hallucinations.  Patient with multiple falls over the last week, unsteady gait, with her husband, who is frail himself, but 2 daughters able to provide care around-the-clock, and they were looking for care facilities, and on Monday there was plan to provide TB test and referral to for placement, around 3 times last week, most recent on Tuesday, since then she has been having difficult time to ambulate, stand up, planing of left lower back pain, which prompted family to bring her to ED.  In ED no significant lab abnormalities, CT pelvis significant for mildly displaced left sacral fracture, she was unable to stand up or ambulate, so [facility] hospitalist were requested to admit.  BMP showed sodium 139, potassium 3.6, bicarbonate 25, serum creatinine 0.62.  WBC 7.8, hemoglobin 12.5, platelet 409.  During the hospitalization, the patient remained afebrile and hemodynamically stable. Her pain remained controlled without opioids.  She participated with PT whom recommended SNF.

## 2023-11-19 NOTE — NC FL2 (Signed)
    MEDICAID FL2 LEVEL OF CARE FORM     IDENTIFICATION  Patient Name: [name] Birthdate: 1940/07/23 Sex: female Admission Date (Current Location): 11/18/2023  [facility] Number:  [employer] and Address:  [facility],  [address] [place] [address]      Provider Number: [id number]  Attending Physician Name and Address:  Vicci Afton LITTIE, MD  Relative Name and Phone Number:  [facility] ANN (Daughter)  [phone]    Current Level of Care: Hospital Recommended Level of Care: Skilled Nursing Facility Prior Approval Number:    Date Approved/Denied:   PASRR Number: [id number]  Discharge Plan: SNF    Current Diagnoses: Patient Active Problem List   Diagnosis Date Noted   Ambulatory dysfunction 11/18/2023   Advanced dementia (HCC) 11/18/2023   Mild cognitive impairment 07/22/2015   Chronic headache 07/20/2015   Migraine 07/20/2015    Orientation RESPIRATION BLADDER Height & Weight     Self, Situation  Normal Incontinent Weight: 128 lb 1.4 oz (58.1 kg) Height:  5' 6 (167.6 cm)  BEHAVIORAL SYMPTOMS/MOOD NEUROLOGICAL BOWEL NUTRITION STATUS        Diet (regular)  AMBULATORY STATUS COMMUNICATION OF NEEDS Skin   Extensive Assist Verbally Normal                       Personal Care Assistance Level of Assistance  Bathing, Feeding, Dressing Bathing Assistance: Maximum assistance Feeding assistance: Limited assistance Dressing Assistance: Maximum assistance     Functional Limitations Info  Sight, Hearing, Speech Sight Info: Impaired (eyeglasses) Hearing Info: Adequate Speech Info: Adequate    SPECIAL CARE FACTORS FREQUENCY  PT (By licensed PT), OT (By licensed OT)     PT Frequency: 5x/wk OT Frequency: 5x/wk            Contractures Contractures Info: Not present    Additional Factors Info  Code Status, Allergies, Psychotropic Code Status Info: DNR Allergies Info: No Known  Allergies Psychotropic Info: Seroquel         Current Medications (11/19/2023):  This is the current hospital active medication list Current Facility-Administered Medications  Medication Dose Route Frequency Provider Last Rate Last Admin   acetaminophen  (TYLENOL ) tablet 325 mg  325 mg Oral Q6H PRN Elgergawy, Dawood S, MD   325 mg at 11/18/23 2122   Or   acetaminophen  (TYLENOL ) suppository 650 mg  650 mg Rectal Q6H PRN Elgergawy, Dawood S, MD       enoxaparin (LOVENOX) injection 30 mg  30 mg Subcutaneous Q24H Elgergawy, Dawood S, MD   30 mg at 11/19/23 0948   hydrALAZINE (APRESOLINE) injection 5 mg  5 mg Intravenous Q4H PRN Elgergawy, Dawood S, MD       morphine (PF) 2 MG/ML injection 1 mg  1 mg Intravenous Q4H PRN Elgergawy, Dawood S, MD       polyethylene glycol (MIRALAX / GLYCOLAX) packet 17 g  17 g Oral Daily PRN Elgergawy, Dawood S, MD       QUEtiapine (SEROQUEL) tablet 25 mg  25 mg Oral QHS Elgergawy, Dawood S, MD   25 mg at 11/18/23 2122   tuberculin injection 5 Units  5 Units Intradermal Once Elgergawy, Dawood S, MD         Discharge Medications: Please see discharge summary for a list of discharge medications.  Relevant Imaging Results:  Relevant Lab Results:   Additional Information SSN: [id number]  Hoy DELENA Bigness, LCSW

## 2023-11-19 NOTE — Progress Notes (Addendum)
 PROGRESS NOTE   [name]  FMW:[id number] DOB: 25-Mar-1940 DOA: 11/18/2023 PCP: Chrystal Lamarr RAMAN, MD   Chief Complaint  Patient presents with   Fall   Level of care: Med-Surg  Brief Admission History:  83 y.o. female, past medical history of advanced dementia, progressive over last few years, family unclear what type of dementia, but it does appear to be sporadic dementia given rapid advancement and hallucinations.  Patient with multiple falls over the last week, unsteady gait, with her husband, who is frail himself, but 2 daughters able to provide care around-the-clock, and they were looking for care facilities, and on Monday there was plan to provide TB test and referral to for placement, around 3 times last week, most recent on Tuesday, since then she has been having difficult time to ambulate, stand up, planing of left lower back pain, which prompted family to bring her to ED.  In ED no significant lab abnormalities, CT pelvis significant for mildly displaced left sacral fracture, she was unable to stand up or ambulate, so [facility] hospitalist were requested to admit.   Assessment and Plan: Ambulatory dysfunction Frequent Falls Mild nondisplaced left sacral fracture Generalized Weakness  - PT/OT evaluation requested, recommending SNF placement  - Will consult TOC to initiate placement  - Tb Test done on admission  - Keep on as needed pain regimen - ED Discussed with Ortho on-call Dr. Sharl, recommendations to bear as tolerated, no TLSO indicated,  follow-up with ortho would only be as needed.    Advanced dementia - Patient with history of advanced dementia, totally dependent on all forms of ADL. - Discussed with daughter, high risk for hospital delirium. - Will start on low-dose Seroquel at night - delirium precautions ordered   Essential hypertension - persistently elevated BPs - starting amlodipine 5 mg daily   DVT prophylaxis: enoxaparin  Code Status: DNR  Family  Communication:  Disposition: anticipate need for SNF placement   Consultants:  PT/OT/TOC Procedures:   Antimicrobials:    Subjective: Pt reports she is weak but she will try to work with PT today.  C/o being tired and weak.    Objective: Vitals:   11/18/23 1804 11/18/23 2201 11/19/23 0559 11/19/23 1207  BP: (!) 150/78 (!) 149/85 (!) 163/78 (!) 147/70  Pulse: 78 72 83 78  Resp: 16 16 18 18   Temp: 98 F (36.7 C) 98.1 F (36.7 C) 98.7 F (37.1 C) 97.7 F (36.5 C)  TempSrc: Oral Axillary Oral Axillary  SpO2: 98% 100% 97% 99%  Weight:      Height:       No intake or output data in the 24 hours ending 11/19/23 1235 Filed Weights   11/18/23 1231  Weight: 58.1 kg   Examination:  General exam: Appears calm and comfortable  Respiratory system: Clear to auscultation. Respiratory effort normal. Cardiovascular system: normal S1 & S2 heard. No JVD, murmurs, rubs, gallops or clicks. No pedal edema. Gastrointestinal system: Abdomen is nondistended, soft and nontender. No organomegaly or masses felt. Normal bowel sounds heard. Central nervous system: Alert and disoriented. No focal neurological deficits. Extremities: Symmetric 5 x 5 power. Skin: No rashes, lesions or ulcers. Psychiatry: Judgement and insight appear normal. Mood & affect appropriate.   Data Reviewed: I have personally reviewed following labs and imaging studies  CBC: Recent Labs  Lab 11/18/23 1309  WBC 7.8  HGB 13.5  HCT 40.1  MCV 87.6  PLT 409*    Basic Metabolic Panel: Recent Labs  Lab  11/18/23 1309  NA 139  K 3.6  CL 102  CO2 25  GLUCOSE 81  BUN 17  CREATININE 0.62  CALCIUM 9.7    CBG: No results for input(s): GLUCAP in the last 168 hours.  No results found for this or any previous visit (from the past 240 hours).   Radiology Studies: CT PELVIS WO CONTRAST Result Date: 11/18/2023 CLINICAL DATA:  Provided history: Hip trauma, fracture suspected, xray done EXAM: CT OF THE PELVIS WITHOUT  CONTRAST TECHNIQUE: Multidetector CT imaging of the pelvis was performed according to the standard protocol. Multiplanar CT image reconstructions were also generated. RADIATION DOSE REDUCTION: This exam was performed according to the departmental dose-optimization program which includes automated exposure control, adjustment of the mA and/or kV according to patient size and/or use of iterative reconstruction technique. COMPARISON:  Hip radiograph earlier today. Lumbar spine CT earlier today FINDINGS: Bones/Joint/Cartilage Bones are subjectively under mineralized. Left lateral sacral fracture is minimally displaced, lateral to the sacral foramen. No additional pelvic fracture. The pubic rami are intact. Proximal femurs are intact. No hip dislocation. Mild bilateral hip joint space narrowing and acetabular spurring. Ligaments Suboptimally assessed by CT. Muscles and Tendons No intramuscular hematoma. Soft tissues Colonic diverticulosis without diverticulitis. No presacral hematoma. Scattered subcutaneous edema. IMPRESSION: 1. Minimally displaced left lateral sacral fracture. 2. No additional pelvic fracture. Electronically Signed   By: Andrea Gasman M.D.   On: 11/18/2023 15:27   CT Lumbar Spine Wo Contrast Result Date: 11/18/2023 EXAM: CT OF THE LUMBAR SPINE WITHOUT CONTRAST 11/18/2023 01:23:14 PM TECHNIQUE: CT of the lumbar spine was performed without the administration of intravenous contrast. Multiplanar reformatted images are provided for review. Automated exposure control, iterative reconstruction, and/or weight based adjustment of the mA/kV was utilized to reduce the radiation dose to as low as reasonably achievable. COMPARISON: None available. CLINICAL HISTORY: Back trauma, no prior imaging (Age >= 16y). Trauma; EMS reports pt with multiple falls (3 over the past week), last one was last night. Pt with dementia, CBG 104, 122/68, HR 88 96%RA. FINDINGS: BONES AND ALIGNMENT: Diffuse osteopenia. Mild  curvature of the lumbar spine appears convex towards the right. There are mild compression fractures involving the L2, L3, L4, and L5 vertebrae. Although age indeterminate, the appearance suggests chronic fractures. Most severe fracture is at the L3 level with loss of approximately 20 percent of the vertebral body height. Left sacral wing insufficiency fracture is noted and appears acute (image 114/2). No definite acute fracture of the lumbar vertebrae or subluxation. DEGENERATIVE CHANGES: Moderate disc space narrowing is identified at the L5-S1 level. Multilevel endplate degenerative changes noted. Posterior broad based partially calcified posterior disc bulge identified at the L3-L4 level. Mild central disc bulge/herniation at L5-S1. Bilateral facet arthropathy. SOFT TISSUES: No acute abnormality. Aortic atherosclerotic calcifications. IMPRESSION: 1. Acute left sacral wing insufficiency fracture. 2. Mild multilevel lumbar vertebral compression fractures favored chronic, worst at L3 with approximately 20% height loss. 3. No definite acute lumbar fracture or subluxation. Electronically signed by: Waddell Calk MD 11/18/2023 01:50 PM EDT RP Workstation: [id number]   DG Hip Unilat W or Wo Pelvis 2-3 Views Left Result Date: 11/18/2023 CLINICAL DATA:  Dementia patient post multiple falls. EXAM: DG HIP (WITH OR WITHOUT PELVIS) 2-3V LEFT COMPARISON:  None Available. FINDINGS: The bones are subjectively under mineralized. No evidence of acute fracture. No hip dislocation, femoral head is well seated. Mild bilateral hip osteoarthritis. Pubic rami are intact. No pubic symphyseal or sacroiliac diastasis. No focal soft tissue abnormalities. IMPRESSION:  1. No acute fracture or dislocation of the left hip. 2. Mild bilateral hip osteoarthritis. Electronically Signed   By: Andrea Gasman M.D.   On: 11/18/2023 13:48   CT Cervical Spine Wo Contrast Result Date: 11/18/2023 CLINICAL DATA:  Neck trauma.  Dementia patient post  multiple falls. EXAM: CT CERVICAL SPINE WITHOUT CONTRAST TECHNIQUE: Multidetector CT imaging of the cervical spine was performed without intravenous contrast. Multiplanar CT image reconstructions were also generated. RADIATION DOSE REDUCTION: This exam was performed according to the departmental dose-optimization program which includes automated exposure control, adjustment of the mA and/or kV according to patient size and/or use of iterative reconstruction technique. COMPARISON:  CT cervical spine 05/10/2019 FINDINGS: Alignment: No traumatic subluxation. Trace grade 1 anterolisthesis of C2 on C3, likely facet mediated. Skull base and vertebrae: No acute fracture. Vertebral body heights are maintained. The dens and skull base are intact. Anterior fusion C5-C6 with intact hardware. Soft tissues and spinal canal: No prevertebral fluid or swelling. No visible canal hematoma. Disc levels: Peripherally calcified disc osteophyte complex at C3-C4 and C4-C5 causes minimal mass effect on the spinal canal. Multilevel facet hypertrophy. Upper chest: No acute findings. Other: None. IMPRESSION: 1. No acute fracture or traumatic subluxation of the cervical spine. 2. Anterior fusion C5-C6 with intact hardware. 3. Multilevel degenerative disc disease and facet hypertrophy. Electronically Signed   By: Andrea Gasman M.D.   On: 11/18/2023 13:47   CT Head Wo Contrast Result Date: 11/18/2023 CLINICAL DATA:  Dementia patient post multiple falls. EXAM: CT HEAD WITHOUT CONTRAST TECHNIQUE: Contiguous axial images were obtained from the base of the skull through the vertex without intravenous contrast. RADIATION DOSE REDUCTION: This exam was performed according to the departmental dose-optimization program which includes automated exposure control, adjustment of the mA and/or kV according to patient size and/or use of iterative reconstruction technique. COMPARISON:  Head CT 05/10/2019 FINDINGS: Brain: Generalized atrophy with mild  progression from prior. Periventricular and deep white matter hypodensity typical of chronic small vessel ischemia, also progressed. No intracranial hemorrhage, mass effect, or midline shift. No hydrocephalus. The basilar cisterns are patent. No evidence of territorial infarct or acute ischemia. No extra-axial or intracranial fluid collection. Vascular: Atherosclerosis of skullbase vasculature without hyperdense vessel or abnormal calcification. Skull: No fracture or focal lesion. Sinuses/Orbits: Paranasal sinuses and mastoid air cells are clear. The visualized orbits are unremarkable. Other: No confluent scalp hematoma. IMPRESSION: 1. No acute intracranial abnormality. No skull fracture. 2. Generalized atrophy and chronic small vessel ischemia, mildly progressed from 2021 exam. Electronically Signed   By: Andrea Gasman M.D.   On: 11/18/2023 13:40    Scheduled Meds:  enoxaparin (LOVENOX) injection  30 mg Subcutaneous Q24H   QUEtiapine  25 mg Oral QHS   tuberculin  5 Units Intradermal Once   Continuous Infusions:   LOS: 0 days   Time spent: 55 mins  Artisha Capri Vicci, MD How to contact the [facility] Attending or Consulting provider 7A - 7P or covering provider during after hours 7P -7A, for this patient?  Check the care team in [facility] and look for a) attending/consulting TRH provider listed and b) the TRH team listed Log into [email or web address] to find provider on call.  Locate the TRH provider you are looking for under [facility] and page to a number that you can be directly reached. If you still have difficulty reaching the provider, please page the [facility] (Director on Call) for the Hospitalists listed on amion for assistance.  11/19/2023, 12:35 PM

## 2023-11-20 DIAGNOSIS — R296 Repeated falls: Secondary | ICD-10-CM | POA: Diagnosis not present

## 2023-11-20 DIAGNOSIS — F03C0 Unspecified dementia, severe, without behavioral disturbance, psychotic disturbance, mood disturbance, and anxiety: Secondary | ICD-10-CM | POA: Diagnosis not present

## 2023-11-20 MED ORDER — CHLORHEXIDINE GLUCONATE CLOTH 2 % EX PADS
6.0000 | MEDICATED_PAD | Freq: Every day | CUTANEOUS | Status: DC
  Administered 2023-11-20 – 2023-11-22 (×3): 6 via TOPICAL

## 2023-11-20 MED ORDER — TUBERCULIN PPD 5 UNIT/0.1ML ID SOLN
5.0000 [IU] | Freq: Once | INTRADERMAL | Status: AC
  Filled 2023-11-20: qty 0.1

## 2023-11-20 MED ORDER — METOPROLOL TARTRATE 25 MG PO TABS
12.5000 mg | ORAL_TABLET | Freq: Two times a day (BID) | ORAL | Status: DC
  Administered 2023-11-20 – 2023-11-22 (×4): 12.5 mg via ORAL
  Filled 2023-11-20 (×5): qty 1

## 2023-11-20 NOTE — Evaluation (Signed)
 Occupational Therapy Evaluation Patient Details Name: [name] MRN: [id number] DOB: [birth year] Today's Date: 11/20/2023   History of Present Illness   [name]  is a 83 y.o. female, past medical history of advanced dementia, progressive over last few years, family unclear what type of dementia, but it does appear to be sporadic dementia given rapid advancement and hallucinations.  - Patient with multiple falls over the last week, unsteady gait, with her husband, who is frail himself, but 2 daughters able to provide care around-the-clock, and they were looking for care facilities, and on Monday there was plan to provide TB test and referral to for placement, around 3 times last week, most recent on Tuesday, since then she has been having difficult time to ambulate, stand up, planing of left lower back pain, which prompted family to bring her to ED.  - In ED no significant lab abnormalities, CT pelvis significant for mildly displaced left sacral fracture, she was unable to stand up or ambulate, so [facility] hospital[facility] were requested to admit. (per MD)     Clinical Impressions Pt agreeable to OT evaluation. Pt presents with L sacra fracture but not signs of pain today. Pt is assisted for ADL's at baseline mostly due to cognitive deficits. Pt required mod A for bed mobility and for EOB to chair transfer with RW. Difficult to assess B UE due to cognitive deficits. Pt left in the chair with call bell within reach, chair alarm set, and family present. Pt will benefit from continued OT in the hospital to increase strength, balance, and endurance for safe ADL's.        If plan is discharge home, recommend the following:   A lot of help with walking and/or transfers;A lot of help with bathing/dressing/bathroom;Assistance with cooking/housework;Direct supervision/assist for medications management;Direct supervision/assist for financial management;Assist for transportation;Help with stairs or ramp  for entrance;Assistance with feeding;Supervision due to cognitive status     Functional Status Assessment   Patient has had a recent decline in their functional status and demonstrates the ability to make significant improvements in function in a reasonable and predictable amount of time.     Equipment Recommendations   None recommended by OT             Precautions/Restrictions   Precautions Precautions: Fall Recall of Precautions/Restrictions: Impaired Restrictions Weight Bearing Restrictions Per Provider Order: No     Mobility Bed Mobility Overal bed mobility: Needs Assistance Bed Mobility: Supine to Sit     Supine to sit: Mod assist     General bed mobility comments: Labored movement; tactile cuing for movement as well as physical assist.    Transfers Overall transfer level: Needs assistance Equipment used: Rolling walker (2 wheels) Transfers: Sit to/from Stand, Bed to chair/wheelchair/BSC Sit to Stand: Min assist     Step pivot transfers: Mod assist     General transfer comment: Much cuing to navigate to chair with RW. Unsteady.      Balance Overall balance assessment: Needs assistance Sitting-balance support: Bilateral upper extremity supported, Feet supported Sitting balance-Leahy Scale: Fair Sitting balance - Comments: seated EOB   Standing balance support: During functional activity, Bilateral upper extremity supported Standing balance-Leahy Scale: Poor Standing balance comment: using RW                           ADL either performed or assessed with clinical judgement   ADL Overall ADL's : Needs assistance/impaired Eating/Feeding: Moderate assistance;Maximal assistance;Sitting  Grooming: Moderate assistance;Maximal assistance;Sitting   Upper Body Bathing: Maximal assistance;Sitting;Moderate assistance   Lower Body Bathing: Maximal assistance;Total assistance;Sitting/lateral leans   Upper Body Dressing : Moderate  assistance;Maximal assistance;Sitting   Lower Body Dressing: Maximal assistance;Total assistance;Sitting/lateral leans   Toilet Transfer: Moderate assistance;Rolling walker (2 wheels);Stand-pivot Statistician Details (indicate cue type and reason): EOB to chair with RW Toileting- Clothing Manipulation and Hygiene: Total assistance;Maximal assistance;Sitting/lateral lean;Sit to/from stand               Vision Baseline Vision/History: 1 Wears glasses Ability to See in Adequate Light: 1 Impaired Patient Visual Report: Other (comment) (Family reported the pt has history of seeing things that are not there or complaining that she cannot see.) Additional Comments: No obvious impairments during evaluation today. Will continue to monitor.     Perception Perception: Not tested       Praxis Praxis: Not tested       Pertinent Vitals/Pain Pain Assessment Pain Assessment: Faces Faces Pain Scale: No hurt     Extremity/Trunk Assessment Upper Extremity Assessment Upper Extremity Assessment: Difficult to assess due to impaired cognition;Generalized weakness   Lower Extremity Assessment Lower Extremity Assessment: Defer to PT evaluation   Cervical / Trunk Assessment Cervical / Trunk Assessment: Kyphotic   Communication Communication Communication: No apparent difficulties   Cognition Arousal: Alert Behavior During Therapy: Flat affect Cognition: History of cognitive impairments             OT - Cognition Comments: Advanced dementia at baseline.                 Following commands: Impaired Following commands impaired: Follows one step commands inconsistently     Cueing  General Comments   Cueing Techniques: Verbal cues;Tactile cues;Gestural cues;Visual cues                 Home Living Family/patient expects to be discharged to:: Private residence Living Arrangements: Spouse/significant other Available Help at Discharge: Family;Available 24  hours/day Type of Home: House Home Access: Stairs to enter [employer] of Steps: 3+1 Entrance Stairs-Rails: Can reach both;Left;Right Home Layout: One level     Bathroom Shower/Tub: Producer, Television/film/video: Standard Bathroom Accessibility: Yes How Accessible: Accessible via wheelchair;Accessible via walker Home Equipment: Rolling Walker (2 wheels) (Family ordered a rollator for the patient. Husband has a RW)   Additional Comments: Daughter present to provide pt's history.      Prior Functioning/Environment Prior Level of Function : Patient poor historian/Family not available;Needs assist  Cognitive Assist : ADLs (cognitive)   ADLs (Cognitive): Step by step cues       Mobility Comments: Independent ambulation typically prior to this weak when the family had the pt use a RW. ADLs Comments: Assist needed for all ADL's due to cognitive deficits and difficulty following directions.    OT Problem List: Decreased strength;Decreased range of motion;Decreased activity tolerance;Impaired balance (sitting and/or standing);Impaired vision/perception;Decreased cognition;Decreased safety awareness;Decreased knowledge of use of DME or AE   OT Treatment/Interventions: Self-care/ADL training;Therapeutic exercise;Therapeutic activities;Balance training;Canalith reposition;Cognitive remediation/compensation;Visual/perceptual remediation/compensation;Patient/family education;DME and/or AE instruction      OT Goals(Current goals can be found in the care plan section)   Acute Rehab OT Goals Patient Stated Goal: Improve function/go to rehab. OT Goal Formulation: With family Time For Goal Achievement: 12/04/23 Potential to Achieve Goals: Good   OT Frequency:  Min 2X/week                  AM-PAC OT 6  Clicks Daily Activity     Outcome Measure Help from another person eating meals?: A Lot Help from another person taking care of personal grooming?: A Lot Help from  another person toileting, which includes using toliet, bedpan, or urinal?: A Lot Help from another person bathing (including washing, rinsing, drying)?: A Lot Help from another person to put on and taking off regular upper body clothing?: A Lot Help from another person to put on and taking off regular lower body clothing?: A Lot 6 Click Score: 12   End of Session Equipment Utilized During Treatment: Rolling walker (2 wheels);Gait belt Nurse Communication: Other (comment) (Notified pt was in the chair.)  Activity Tolerance: Patient tolerated treatment well Patient left: in chair;with call bell/phone within reach;with chair alarm set;with family/visitor present  OT Visit Diagnosis: Unsteadiness on feet (R26.81);Other abnormalities of gait and mobility (R26.89);Muscle weakness (generalized) (M62.81);History of falling (Z91.81);Other symptoms and signs involving cognitive function                Time: 1040-1107 OT Time Calculation (min): 27 min Charges:  OT General Charges $OT Visit: 1 Visit OT Evaluation $OT Eval Low Complexity: 1 Low  Dontrelle Mazon OT, MOT  Jayson Person 11/20/2023, 1:35 PM

## 2023-11-20 NOTE — Plan of Care (Signed)
  Problem: Acute Rehab OT Goals (only OT should resolve) Goal: Pt. Will Perform Eating Flowsheets (Taken 11/20/2023 1337) Pt Will Perform Eating:  with set-up  sitting Goal: Pt. Will Perform Grooming Flowsheets (Taken 11/20/2023 1337) Pt Will Perform Grooming:  with supervision  with set-up  sitting Goal: Pt. Will Perform Upper Body Dressing Flowsheets (Taken 11/20/2023 1337) Pt Will Perform Upper Body Dressing:  with set-up  with supervision  sitting Goal: Pt. Will Perform Lower Body Dressing Flowsheets (Taken 11/20/2023 1337) Pt Will Perform Lower Body Dressing:  with min assist  sitting/lateral leans Goal: Pt. Will Transfer To Toilet Flowsheets (Taken 11/20/2023 1337) Pt Will Transfer to Toilet:  with contact guard assist  ambulating Goal: Pt/Caregiver Will Perform Home Exercise Program Flowsheets (Taken 11/20/2023 1337) Pt/caregiver will Perform Home Exercise Program:  Increased strength  Increased ROM  Both right and left upper extremity  With minimal assist  Gregoria Selvy OT, MOT

## 2023-11-20 NOTE — Progress Notes (Addendum)
 Mobility Specialist Progress Note:    11/20/23 1300  Pain Assessment  Pain Assessment Faces  Faces Pain Scale 0  Mobility  Activity Ambulated with assistance  Level of Assistance Minimal assist, patient does 75% or more  Assistive Device Front wheel walker  Distance Ambulated (ft) 22 ft  Range of Motion/Exercises Active;All extremities  Activity Response Tolerated well  Mobility Referral Yes  Mobility visit 1 Mobility  Mobility Specialist Start Time (ACUTE ONLY) 1300  Mobility Specialist Stop Time (ACUTE ONLY) 1320  Mobility Specialist Time Calculation (min) (ACUTE ONLY) 20 min   Pt received in chair, agreeable to mobility. Required MinA to stand and ambulate with RW. Tolerated well, required many verbal cues for directional tasks during ambulation. Left supine, family in room. Alarm on, all needs met.  [name] Mobility Specialist Please contact via Special Educational Needs Teacher or  Rehab office at [phone]

## 2023-11-20 NOTE — Progress Notes (Signed)
 Tuberculosis test administered on the posterior side of the forearm. Site marked. Test interpretation due 11/22/2023 14:55.

## 2023-11-20 NOTE — Plan of Care (Signed)

## 2023-11-20 NOTE — Progress Notes (Signed)
 PROGRESS NOTE   [name]  FMW:[id number] DOB: 22-May-1940 DOA: 11/18/2023 PCP: Chrystal Lamarr RAMAN, MD   Chief Complaint  Patient presents with   Fall   Level of care: Med-Surg  Brief Admission History:  83 y.o. female, past medical history of advanced dementia, progressive over last few years, family unclear what type of dementia, but it does appear to be sporadic dementia given rapid advancement and hallucinations.  Patient with multiple falls over the last week, unsteady gait, with her husband, who is frail himself, but 2 daughters able to provide care around-the-clock, and they were looking for care facilities, and on Monday there was plan to provide TB test and referral to for placement, around 3 times last week, most recent on Tuesday, since then she has been having difficult time to ambulate, stand up, planing of left lower back pain, which prompted family to bring her to ED.  In ED no significant lab abnormalities, CT pelvis significant for mildly displaced left sacral fracture, she was unable to stand up or ambulate, so [facility] were requested to admit.   Assessment and Plan: Ambulatory dysfunction Frequent Falls Mild nondisplaced left sacral fracture Generalized Weakness  - PT/OT evaluation requested, recommending SNF placement  - Will consult TOC to initiate placement  - Tb Test done on admission  - Keep on as needed pain regimen - ED Discussed with Ortho on-call Dr. Sharl, recommendations to bear as tolerated, no TLSO indicated,  follow-up with ortho would only be as needed.    Advanced dementia - Patient with history of advanced dementia, totally dependent on all forms of ADL. - Discussed with daughter, high risk for hospital delirium. - Will start on low-dose Seroquel at night - delirium precautions ordered   Acute urinary retention - foley cath placed, leave in for now due to severe agitation from dementia with placement - likely can do foley removal  and void trial at SNF  Essential hypertension - persistently elevated BPs - started amlodipine 5 mg daily - adding metoprolol 12.5 mg BID   DVT prophylaxis: enoxaparin  Code Status: DNR  Family Communication:  Disposition: anticipate need for SNF placement   Consultants:  PT/OT/TOC Procedures:   Antimicrobials:    Subjective: Pt says she is hungry to eat breakfast.    Objective: Vitals:   11/19/23 1637 11/19/23 2030 11/20/23 0410 11/20/23 0810  BP: (!) 154/93 (!) 142/85 (!) 178/67 138/87  Pulse: 80 95 86   Resp:  18 16   Temp: 98.7 F (37.1 C) 98.2 F (36.8 C) 98.1 F (36.7 C)   TempSrc: Oral Oral Oral   SpO2: 97% 94% 96%   Weight:      Height:        Intake/Output Summary (Last 24 hours) at 11/20/2023 1259 Last data filed at 11/20/2023 0800 Gross per 24 hour  Intake 360 ml  Output 350 ml  Net 10 ml   Filed Weights   11/18/23 1231  Weight: 58.1 kg   Examination:  General exam: Appears calm and comfortable NAD Respiratory system: Clear to auscultation. Respiratory effort normal. Cardiovascular system: normal S1 & S2 heard. No JVD, murmurs, rubs, gallops or clicks. No pedal edema. Gastrointestinal system: Abdomen is nondistended, soft and nontender. No organomegaly or masses felt. Normal bowel sounds heard. Central nervous system: Alert and disoriented. No focal neurological deficits. Extremities: Symmetric 5 x 5 power. Skin: No rashes, lesions or ulcers. Psychiatry: Judgement and insight appear normal. Mood & affect appropriate.   Data Reviewed:  I have personally reviewed following labs and imaging studies  CBC: Recent Labs  Lab 11/18/23 1309  WBC 7.8  HGB 13.5  HCT 40.1  MCV 87.6  PLT 409*    Basic Metabolic Panel: Recent Labs  Lab 11/18/23 1309  NA 139  K 3.6  CL 102  CO2 25  GLUCOSE 81  BUN 17  CREATININE 0.62  CALCIUM 9.7    CBG: No results for input(s): GLUCAP in the last 168 hours.  No results found for this or any  previous visit (from the past 240 hours).   Radiology Studies: CT PELVIS WO CONTRAST Result Date: 11/18/2023 CLINICAL DATA:  Provided history: Hip trauma, fracture suspected, xray done EXAM: CT OF THE PELVIS WITHOUT CONTRAST TECHNIQUE: Multidetector CT imaging of the pelvis was performed according to the standard protocol. Multiplanar CT image reconstructions were also generated. RADIATION DOSE REDUCTION: This exam was performed according to the departmental dose-optimization program which includes automated exposure control, adjustment of the mA and/or kV according to patient size and/or use of iterative reconstruction technique. COMPARISON:  Hip radiograph earlier today. Lumbar spine CT earlier today FINDINGS: Bones/Joint/Cartilage Bones are subjectively under mineralized. Left lateral sacral fracture is minimally displaced, lateral to the sacral foramen. No additional pelvic fracture. The pubic rami are intact. Proximal femurs are intact. No hip dislocation. Mild bilateral hip joint space narrowing and acetabular spurring. Ligaments Suboptimally assessed by CT. Muscles and Tendons No intramuscular hematoma. Soft tissues Colonic diverticulosis without diverticulitis. No presacral hematoma. Scattered subcutaneous edema. IMPRESSION: 1. Minimally displaced left lateral sacral fracture. 2. No additional pelvic fracture. Electronically Signed   By: Andrea Gasman M.D.   On: 11/18/2023 15:27   CT Lumbar Spine Wo Contrast Result Date: 11/18/2023 EXAM: CT OF THE LUMBAR SPINE WITHOUT CONTRAST 11/18/2023 01:23:14 PM TECHNIQUE: CT of the lumbar spine was performed without the administration of intravenous contrast. Multiplanar reformatted images are provided for review. Automated exposure control, iterative reconstruction, and/or weight based adjustment of the mA/kV was utilized to reduce the radiation dose to as low as reasonably achievable. COMPARISON: None available. CLINICAL HISTORY: Back trauma, no prior  imaging (Age >= 16y). Trauma; EMS reports pt with multiple falls (3 over the past week), last one was last night. Pt with dementia, CBG 104, 122/68, HR 88 96%RA. FINDINGS: BONES AND ALIGNMENT: Diffuse osteopenia. Mild curvature of the lumbar spine appears convex towards the right. There are mild compression fractures involving the L2, L3, L4, and L5 vertebrae. Although age indeterminate, the appearance suggests chronic fractures. Most severe fracture is at the L3 level with loss of approximately 20 percent of the vertebral body height. Left sacral wing insufficiency fracture is noted and appears acute (image 114/2). No definite acute fracture of the lumbar vertebrae or subluxation. DEGENERATIVE CHANGES: Moderate disc space narrowing is identified at the L5-S1 level. Multilevel endplate degenerative changes noted. Posterior broad based partially calcified posterior disc bulge identified at the L3-L4 level. Mild central disc bulge/herniation at L5-S1. Bilateral facet arthropathy. SOFT TISSUES: No acute abnormality. Aortic atherosclerotic calcifications. IMPRESSION: 1. Acute left sacral wing insufficiency fracture. 2. Mild multilevel lumbar vertebral compression fractures favored chronic, worst at L3 with approximately 20% height loss. 3. No definite acute lumbar fracture or subluxation. Electronically signed by: Waddell Calk MD 11/18/2023 01:50 PM EDT RP Workstation: [id number]   DG Hip Unilat W or Wo Pelvis 2-3 Views Left Result Date: 11/18/2023 CLINICAL DATA:  Dementia patient post multiple falls. EXAM: DG HIP (WITH OR WITHOUT PELVIS) 2-3V LEFT COMPARISON:  None Available. FINDINGS: The bones are subjectively under mineralized. No evidence of acute fracture. No hip dislocation, femoral head is well seated. Mild bilateral hip osteoarthritis. Pubic rami are intact. No pubic symphyseal or sacroiliac diastasis. No focal soft tissue abnormalities. IMPRESSION: 1. No acute fracture or dislocation of the left hip. 2.  Mild bilateral hip osteoarthritis. Electronically Signed   By: Andrea Gasman M.D.   On: 11/18/2023 13:48   CT Cervical Spine Wo Contrast Result Date: 11/18/2023 CLINICAL DATA:  Neck trauma.  Dementia patient post multiple falls. EXAM: CT CERVICAL SPINE WITHOUT CONTRAST TECHNIQUE: Multidetector CT imaging of the cervical spine was performed without intravenous contrast. Multiplanar CT image reconstructions were also generated. RADIATION DOSE REDUCTION: This exam was performed according to the departmental dose-optimization program which includes automated exposure control, adjustment of the mA and/or kV according to patient size and/or use of iterative reconstruction technique. COMPARISON:  CT cervical spine 05/10/2019 FINDINGS: Alignment: No traumatic subluxation. Trace grade 1 anterolisthesis of C2 on C3, likely facet mediated. Skull base and vertebrae: No acute fracture. Vertebral body heights are maintained. The dens and skull base are intact. Anterior fusion C5-C6 with intact hardware. Soft tissues and spinal canal: No prevertebral fluid or swelling. No visible canal hematoma. Disc levels: Peripherally calcified disc osteophyte complex at C3-C4 and C4-C5 causes minimal mass effect on the spinal canal. Multilevel facet hypertrophy. Upper chest: No acute findings. Other: None. IMPRESSION: 1. No acute fracture or traumatic subluxation of the cervical spine. 2. Anterior fusion C5-C6 with intact hardware. 3. Multilevel degenerative disc disease and facet hypertrophy. Electronically Signed   By: Andrea Gasman M.D.   On: 11/18/2023 13:47   CT Head Wo Contrast Result Date: 11/18/2023 CLINICAL DATA:  Dementia patient post multiple falls. EXAM: CT HEAD WITHOUT CONTRAST TECHNIQUE: Contiguous axial images were obtained from the base of the skull through the vertex without intravenous contrast. RADIATION DOSE REDUCTION: This exam was performed according to the departmental dose-optimization program which  includes automated exposure control, adjustment of the mA and/or kV according to patient size and/or use of iterative reconstruction technique. COMPARISON:  Head CT 05/10/2019 FINDINGS: Brain: Generalized atrophy with mild progression from prior. Periventricular and deep white matter hypodensity typical of chronic small vessel ischemia, also progressed. No intracranial hemorrhage, mass effect, or midline shift. No hydrocephalus. The basilar cisterns are patent. No evidence of territorial infarct or acute ischemia. No extra-axial or intracranial fluid collection. Vascular: Atherosclerosis of skullbase vasculature without hyperdense vessel or abnormal calcification. Skull: No fracture or focal lesion. Sinuses/Orbits: Paranasal sinuses and mastoid air cells are clear. The visualized orbits are unremarkable. Other: No confluent scalp hematoma. IMPRESSION: 1. No acute intracranial abnormality. No skull fracture. 2. Generalized atrophy and chronic small vessel ischemia, mildly progressed from 2021 exam. Electronically Signed   By: Andrea Gasman M.D.   On: 11/18/2023 13:40    Scheduled Meds:  amLODipine  5 mg Oral Daily   Chlorhexidine Gluconate Cloth  6 each Topical Q0600   enoxaparin (LOVENOX) injection  40 mg Subcutaneous Q24H   QUEtiapine  25 mg Oral QHS   tuberculin  5 Units Intradermal Once   Continuous Infusions:   LOS: 0 days   Time spent: 55 mins  Ayren Zumbro Vicci, MD How to contact the [facility] Attending or Consulting provider 7A - 7P or covering provider during after hours 7P -7A, for this patient?  Check the care team in [facility] and look for a) attending/consulting [facility] provider listed and b) the [facility] team listed Log  into [email or web address] to find provider on call.  Locate the [facility] provider you are looking for under [facility] and page to a number that you can be directly reached. If you still have difficulty reaching the provider, please page the [facility] (Director on Call) for the Hospitalists  listed on amion for assistance.  11/20/2023, 12:59 PM

## 2023-11-21 DIAGNOSIS — Z66 Do not resuscitate: Secondary | ICD-10-CM | POA: Diagnosis present

## 2023-11-21 DIAGNOSIS — I10 Essential (primary) hypertension: Secondary | ICD-10-CM | POA: Diagnosis present

## 2023-11-21 DIAGNOSIS — F05 Delirium due to known physiological condition: Secondary | ICD-10-CM | POA: Diagnosis not present

## 2023-11-21 DIAGNOSIS — Z90710 Acquired absence of both cervix and uterus: Secondary | ICD-10-CM | POA: Diagnosis not present

## 2023-11-21 DIAGNOSIS — W19XXXA Unspecified fall, initial encounter: Secondary | ICD-10-CM | POA: Diagnosis not present

## 2023-11-21 DIAGNOSIS — R531 Weakness: Secondary | ICD-10-CM | POA: Diagnosis present

## 2023-11-21 DIAGNOSIS — Z801 Family history of malignant neoplasm of trachea, bronchus and lung: Secondary | ICD-10-CM | POA: Diagnosis not present

## 2023-11-21 DIAGNOSIS — F03C0 Unspecified dementia, severe, without behavioral disturbance, psychotic disturbance, mood disturbance, and anxiety: Secondary | ICD-10-CM | POA: Diagnosis not present

## 2023-11-21 DIAGNOSIS — R339 Retention of urine, unspecified: Secondary | ICD-10-CM | POA: Diagnosis present

## 2023-11-21 DIAGNOSIS — S3210XA Unspecified fracture of sacrum, initial encounter for closed fracture: Secondary | ICD-10-CM | POA: Diagnosis present

## 2023-11-21 DIAGNOSIS — F0390 Unspecified dementia without behavioral disturbance: Secondary | ICD-10-CM | POA: Diagnosis present

## 2023-11-21 DIAGNOSIS — R296 Repeated falls: Secondary | ICD-10-CM | POA: Diagnosis present

## 2023-11-21 DIAGNOSIS — R262 Difficulty in walking, not elsewhere classified: Secondary | ICD-10-CM | POA: Diagnosis present

## 2023-11-21 NOTE — Progress Notes (Signed)
 Physical Therapy Treatment Patient Details Name: [name] MRN: [id number] DOB: Feb 22, 1940 Today's Date: 11/21/2023   History of Present Illness [name]  is a 83 y.o. female, past medical history of advanced dementia, progressive over last few years, family unclear what type of dementia, but it does appear to be sporadic dementia given rapid advancement and hallucinations.  - Patient with multiple falls over the last week, unsteady gait, with her husband, who is frail himself, but 2 daughters able to provide care around-the-clock, and they were looking for care facilities, and on Monday there was plan to provide TB test and referral to for placement, around 3 times last week, most recent on Tuesday, since then she has been having difficult time to ambulate, stand up, planing of left lower back pain, which prompted family to bring her to ED.  - In ED no significant lab abnormalities, CT pelvis significant for mildly displaced left sacral fracture, she was unable to stand up or ambulate, so [facility] hospital[facility] were requested to admit.    PT Comments  Patient agreeable to PT treatment session once aroused. Patient's daughter present during session and contributes to session by encouraging and giving verbal cues to mother. Pt requires more assist this date, moderate/max, with bed mobility as pt presents with increased confusion this date. Once standing, pt has difficult time with taking forward or side steps with different forms for cueing. Pt daughter assists here. Moderate assist throughout with managing RW and with balance. Pt tolerates sitting in chair at end of session, chair alarm set, and daughter present. Patient will benefit from continued skilled physical therapy acutely and in recommended venue in order to address current deficits to improve function.     If plan is discharge home, recommend the following: A lot of help with walking and/or transfers;A lot of help with  bathing/dressing/bathroom;Assist for transportation;Supervision due to cognitive status;Assistance with cooking/housework   Can travel by private vehicle        Equipment Recommendations       Recommendations for Other Services       Precautions / Restrictions Precautions Precautions: Fall Recall of Precautions/Restrictions: Impaired Restrictions Weight Bearing Restrictions Per Provider Order: No     Mobility  Bed Mobility Overal bed mobility: Needs Assistance Bed Mobility: Supine to Sit     Supine to sit: Mod assist, Max assist     General bed mobility comments: Pt demo inc pain with mobility this date, very slow labored movement, mod/max assist required for trunk and LE handling    Transfers Overall transfer level: Needs assistance Equipment used: Rolling walker (2 wheels) Transfers: Sit to/from Stand, Bed to chair/wheelchair/BSC Sit to Stand: Mod assist   Step pivot transfers: Mod assist       General transfer comment: Increased cueing/encouragement needed during session this date for transfer, pt daughter assist with verbal cueing. Pt with poor foot clearance, dec step lengths, and very unsteady, Reports pain and fear    Ambulation/Gait         General Gait Details: unable to ambulate this date due to pain, fear, and inc confusion   Stairs             Wheelchair Mobility     Tilt Bed    Modified Rankin (Stroke Patients Only)       Balance Overall balance assessment: Needs assistance Sitting-balance support: Feet unsupported, Bilateral upper extremity supported, Feet supported, No upper extremity supported Sitting balance-Leahy Scale: Good Sitting balance - Comments: pt demo  good seated balance with varying support while seated EOB   Standing balance support: During functional activity, Bilateral upper extremity supported, Reliant on assistive device for balance Standing balance-Leahy Scale: Poor Standing balance comment: using RW               Communication Communication Communication: No apparent difficulties  Cognition Arousal: Lethargic Behavior During Therapy: Flat affect   PT - Cognitive impairments: History of cognitive impairments             Following commands: Impaired Following commands impaired: Follows one step commands inconsistently    Cueing Cueing Techniques: Verbal cues, Tactile cues, Gestural cues, Visual cues  Exercises      General Comments        Pertinent Vitals/Pain Pain Assessment Pain Assessment: Faces Faces Pain Scale: Hurts even more Pain Location: pt reporting pain in L hip during transfer Pain Descriptors / Indicators: Moaning Pain Intervention(s): Limited activity within patient's tolerance, Repositioned    Home Living                          Prior Function            PT Goals (current goals can now be found in the care plan section) Acute Rehab PT Goals Patient Stated Goal: return home PT Goal Formulation: With patient Time For Goal Achievement: 12/04/23 Potential to Achieve Goals: Good Progress towards PT goals: Progressing toward goals    Frequency    Min 3X/week      PT Plan      Co-evaluation              AM-PAC PT 6 Clicks Mobility   Outcome Measure  Help needed turning from your back to your side while in a flat bed without using bedrails?: A Lot Help needed moving from lying on your back to sitting on the side of a flat bed without using bedrails?: A Lot Help needed moving to and from a bed to a chair (including a wheelchair)?: A Lot Help needed standing up from a chair using your arms (e.g., wheelchair or bedside chair)?: A Lot Help needed to walk in hospital room?: A Lot Help needed climbing 3-5 steps with a railing? : A Lot 6 Click Score: 12    End of Session Equipment Utilized During Treatment: Gait belt Activity Tolerance: Patient limited by pain;Other (comment) (Limited due to impaired cognition) Patient  left: with call bell/phone within reach;in chair;with chair alarm set;with family/visitor present   PT Visit Diagnosis: Unsteadiness on feet (R26.81);Other abnormalities of gait and mobility (R26.89);Muscle weakness (generalized) (M62.81);History of falling (Z91.81);Difficulty in walking, not elsewhere classified (R26.2);Pain Pain - Right/Left: Left Pain - part of body: Hip     Time: 1500-1520 PT Time Calculation (min) (ACUTE ONLY): 20 min  Charges:    $Therapeutic Activity: 8-22 mins PT General Charges $$ ACUTE PT VISIT: 1 Visit                     5:05 PM, 11/21/23 Caryn Gienger Powell-Butler, PT, DPT [place] with [facility]

## 2023-11-21 NOTE — Plan of Care (Signed)
  Problem: Clinical Measurements: Goal: Ability to maintain clinical measurements within normal limits will improve Outcome: Progressing Goal: Will remain free from infection Outcome: Progressing Goal: Diagnostic test results will improve Outcome: Progressing   Problem: Activity: Goal: Risk for activity intolerance will decrease Outcome: Progressing   Problem: Nutrition: Goal: Adequate nutrition will be maintained Outcome: Progressing   Problem: Coping: Goal: Level of anxiety will decrease Outcome: Progressing   Problem: Pain Managment: Goal: General experience of comfort will improve and/or be controlled Outcome: Progressing   Problem: Skin Integrity: Goal: Risk for impaired skin integrity will decrease Outcome: Progressing

## 2023-11-21 NOTE — Progress Notes (Signed)
 Pt had episodes of restlessness and anxiety throughout the night, prn medication administered to help pt relax and rest and mits also placed on pt to help prevent her from pulling out her foley and IV's, pt resting well at this time.

## 2023-11-21 NOTE — Progress Notes (Signed)
 PROGRESS NOTE   [name]  FMW:[id number] DOB: April 17, 1940 DOA: 11/18/2023 PCP: Diana Lamarr RAMAN, MD   Chief Complaint  Patient presents with   Fall   Level of care: Med-Surg  Brief Admission History:  83 y.o. female, past medical history of advanced dementia, progressive over last few years, family unclear what type of dementia, but it does appear to be sporadic dementia given rapid advancement and hallucinations.  Patient with multiple falls over the last week, unsteady gait, with her husband, who is frail himself, but 2 daughters able to provide care around-the-clock, and they were looking for care facilities, and on Monday there was plan to provide TB test and referral to for placement, around 3 times last week, most recent on Tuesday, since then she has been having difficult time to ambulate, stand up, planing of left lower back pain, which prompted family to bring her to ED.  In ED no significant lab abnormalities, CT pelvis significant for mildly displaced left sacral fracture, she was unable to stand up or ambulate, so [facility] hospital[facility] were requested to admit.   Assessment and Plan:  Ambulatory dysfunction Frequent Falls Mild nondisplaced left sacral fracture Generalized Weakness  - PT/OT evaluation requested, recommending SNF placement  - Will consult TOC to initiate placement  - Tb Test done on admission  - Keep on as needed pain regimen - ED Discussed with Ortho on-call [name], recommendations to bear as tolerated, no TLSO indicated,  follow-up with ortho would only be as needed.    Advanced dementia - Patient with history of advanced dementia, totally dependent on all forms of ADL. - Discussed with daughter, high risk for hospital delirium and sundowning - low-dose Seroquel at night for sundowning in hospital with no plan to continue this at discharge - delirium precautions ordered while in hospital   Acute urinary retention - foley cath placed, leave in  for now due to severe agitation from dementia with placement - likely can do foley removal and void trial at SNF  Essential hypertension - persistently elevated BPs much improved now with treatment - started amlodipine 5 mg daily - added metoprolol 12.5 mg BID   DVT prophylaxis: enoxaparin  Code Status: DNR  Family Communication: not present during rounds Disposition: auth pending for SNF with plan to go to [facility] tomorrow if insurance authorizes  Consultants:  PT/OT/TOC Procedures:   Antimicrobials:    Subjective: Pt resting comfortably, no apparent distress     Objective: Vitals:   11/20/23 1427 11/20/23 1455 11/20/23 1930 11/21/23 0439  BP:  (!) 160/80 137/85 125/70  Pulse: (!) 105 (!) 104 (!) 105 86  Resp: 18  18 16   Temp: 98.2 F (36.8 C)  98.2 F (36.8 C) 97.6 F (36.4 C)  TempSrc:   Oral Axillary  SpO2: 92%  94% 97%  Weight:      Height:        Intake/Output Summary (Last 24 hours) at 11/21/2023 1135 Last data filed at 11/21/2023 1009 Gross per 24 hour  Intake 0 ml  Output 1200 ml  Net -1200 ml   Filed Weights   11/18/23 1231  Weight: 58.1 kg   Examination:  General exam: Appears calm and comfortable NAD Respiratory system: Clear to auscultation. Respiratory effort normal. Cardiovascular system: normal S1 & S2 heard. No JVD, murmurs, rubs, gallops or clicks. No pedal edema. Gastrointestinal system: Abdomen is nondistended, soft and nontender. No organomegaly or masses felt. Normal bowel sounds heard. Central nervous system: Alert  and disoriented. No focal neurological deficits. Extremities: Symmetric 5 x 5 power. Skin: No rashes, lesions or ulcers. Psychiatry: Judgement and insight appear normal. Mood & affect appropriate.   Data Reviewed: I have personally reviewed following labs and imaging studies  CBC: Recent Labs  Lab 11/18/23 1309  WBC 7.8  HGB 13.5  HCT 40.1  MCV 87.6  PLT 409*    Basic Metabolic Panel: Recent Labs  Lab  11/18/23 1309  NA 139  K 3.6  CL 102  CO2 25  GLUCOSE 81  BUN 17  CREATININE 0.62  CALCIUM 9.7    CBG: No results for input(s): GLUCAP in the last 168 hours.  No results found for this or any previous visit (from the past 240 hours).   Radiology Studies: No results found.   Scheduled Meds:  amLODipine  5 mg Oral Daily   Chlorhexidine Gluconate Cloth  6 each Topical Q0600   enoxaparin (LOVENOX) injection  40 mg Subcutaneous Q24H   metoprolol tartrate  12.5 mg Oral BID   QUEtiapine  25 mg Oral QHS   tuberculin  5 Units Intradermal Once   Continuous Infusions:   LOS: 0 days   Time spent: 46 mins  Diana Beshears Vicci, MD How to contact the [facility] Attending or Consulting provider 7A - 7P or covering provider during after hours 7P -7A, for this patient?  Check the care team in [facility] and look for a) attending/consulting [facility] provider listed and b) the TRH team listed Log into [email or web address] to find provider on call.  Locate the TR[facility] provider you are looking for under [facility] and page to a number that you can be directly reached. If you still have difficulty reaching the provider, please page the [facility] (Director on Call) for the Hospitalists listed on amion for assistance.  11/21/2023, 11:35 AM

## 2023-11-21 NOTE — TOC Progression Note (Signed)
 Transition of Care [facility]) - Progression Note    Patient Details  Name: [name] MRN: [id number] Date of Birth: 1940/06/12  [facility] CM/SW Contact  Sharlyne Stabs, RN Phone Number: 11/21/2023, 9:54 AM  Clinical Narrative:   Discussed bed offer with Daughter, Ronal Caldron. She wanted [facility], this will put her mother near her. Auth started, Team updated.    Expected Discharge Plan: Skilled Nursing Facility Barriers to Discharge: Insurance Authorization   Social Drivers of Health (SDOH) Interventions SDOH Screenings   Food Insecurity: Patient Unable To Answer (11/18/2023)  Housing: Unknown (11/18/2023)  Transportation Needs: Patient Unable To Answer (11/18/2023)  Utilities: Patient Unable To Answer (11/18/2023)  Social Connections: Patient Unable To Answer (11/18/2023)  Tobacco Use: Low Risk  (11/18/2023)

## 2023-11-22 DIAGNOSIS — F0390 Unspecified dementia without behavioral disturbance: Secondary | ICD-10-CM | POA: Diagnosis not present

## 2023-11-22 DIAGNOSIS — S3210XA Unspecified fracture of sacrum, initial encounter for closed fracture: Secondary | ICD-10-CM | POA: Diagnosis not present

## 2023-11-22 DIAGNOSIS — W19XXXA Unspecified fall, initial encounter: Secondary | ICD-10-CM | POA: Diagnosis not present

## 2023-11-22 DIAGNOSIS — R262 Difficulty in walking, not elsewhere classified: Secondary | ICD-10-CM | POA: Diagnosis not present

## 2023-11-22 MED ORDER — POLYETHYLENE GLYCOL 3350 17 G PO PACK: 17.0000 g | PACK | Freq: Every day | ORAL | 0 refills | Status: AC | PRN

## 2023-11-22 MED ORDER — METOPROLOL TARTRATE 25 MG PO TABS: 12.5000 mg | ORAL_TABLET | Freq: Two times a day (BID) | ORAL | Status: AC

## 2023-11-22 MED ORDER — ACETAMINOPHEN 500 MG PO TABS
1000.0000 mg | ORAL_TABLET | Freq: Three times a day (TID) | ORAL | Status: DC
  Filled 2023-11-22: qty 2

## 2023-11-22 MED ORDER — ACETAMINOPHEN 500 MG PO TABS: 1000.0000 mg | ORAL_TABLET | Freq: Three times a day (TID) | ORAL | Status: AC

## 2023-11-22 MED ORDER — AMLODIPINE BESYLATE 5 MG PO TABS: 5.0000 mg | ORAL_TABLET | Freq: Every day | ORAL | Status: AC

## 2023-11-22 NOTE — Discharge Summary (Signed)
 Physician Discharge Summary   Patient: [name] MRN: [id number] DOB: 1940-09-28  Admit date:     11/18/2023  Discharge date: 11/22/23  Discharge Physician: Alm Rennie Rouch   PCP: Chrystal Lamarr RAMAN, MD   Recommendations at discharge:   Please follow up with primary care provider within 1-2 weeks  Please repeat BMP and CBC in one week Please remove foley catheter on 11/27/23 for voiding trial      Hospital Course: 83 y.o. female, past medical history of advanced dementia, progressive over last few years, family unclear what type of dementia, but it does appear to be sporadic dementia given rapid advancement and hallucinations.  Patient with multiple falls over the last week, unsteady gait, with her husband, who is frail himself, but 2 daughters able to provide care around-the-clock, and they were looking for care facilities, and on Monday there was plan to provide TB test and referral to for placement, around 3 times last week, most recent on Tuesday, since then she has been having difficult time to ambulate, stand up, planing of left lower back pain, which prompted family to bring her to ED.  In ED no significant lab abnormalities, CT pelvis significant for mildly displaced left sacral fracture, she was unable to stand up or ambulate, so [facility] were requested to admit.  BMP showed sodium 139, potassium 3.6, bicarbonate 25, serum creatinine 0.62.  WBC 7.8, hemoglobin 12.5, platelet 409.  During the hospitalization, the patient remained afebrile and hemodynamically stable. Her pain remained controlled without opioids.  She participated with PT whom recommended SNF.  Assessment and Plan: Ambulatory dysfunction Frequent Falls Mild nondisplaced left sacral fracture Generalized Weakness  - PT/OT evaluation requested, recommending SNF placement  - Will consult TOC to initiate placement  - Tb Test done on admission --neg to date - Keep on as needed pain regimen - ED Discussed  with Ortho on-call Dr. Sharl, recommendations to bear as tolerated, no TLSO indicated,  follow-up with ortho would only be as needed.    Major Neurocognitive disorder - Patient with history of advanced dementia, totally dependent on all forms of ADL. - Discussed with daughter, high risk for hospital delirium and sundowning - low-dose Seroquel at night for sundowning in hospital with no plan to continue this at discharge - delirium precautions ordered while in hospital    Acute urinary retention - foley cath placed, leave in for now due to severe agitation from dementia with placement - remove for voiding trial on 113/25   Essential hypertension - persistently elevated BPs much improved now with treatment - started amlodipine 5 mg daily - added metoprolol 12.5 mg BID        Consultants: none Procedures performed: none  Disposition: Home Diet recommendation:  Regular diet DISCHARGE MEDICATION: Allergies as of 11/22/2023   No Known Allergies      Medication List     TAKE these medications    acetaminophen  500 MG tablet Commonly known as: TYLENOL  Take 2 tablets (1,000 mg total) by mouth every 8 (eight) hours.   amLODipine 5 MG tablet Commonly known as: NORVASC Take 1 tablet (5 mg total) by mouth daily. Start taking on: November 23, 2023   metoprolol tartrate 25 MG tablet Commonly known as: LOPRESSOR Take 0.5 tablets (12.5 mg total) by mouth 2 (two) times daily.   polyethylene glycol 17 g packet Commonly known as: MIRALAX / GLYCOLAX Take 17 g by mouth daily as needed for mild constipation.  Contact information for after-discharge care     Destination     [facility] .   Service: Skilled Nursing Contact information: [address] [place]  [phone]                    Discharge Exam: [name]   11/18/23 1231  Weight: 58.1 kg   HEENT:  [place]/AT, No thrush, no icterus CV:  RRR, no rub, no S3, no  S4 Lung:  CTA, no wheeze, no rhonchi Abd:  soft/+BS, NT Ext:  No edema, no lymphangitis, no synovitis, no rash   Condition at discharge: stable  The results of significant diagnostics from this hospitalization (including imaging, microbiology, ancillary and laboratory) are listed below for reference.   Imaging Studies: CT PELVIS WO CONTRAST Result Date: 11/18/2023 CLINICAL DATA:  Provided history: Hip trauma, fracture suspected, xray done EXAM: CT OF THE PELVIS WITHOUT CONTRAST TECHNIQUE: Multidetector CT imaging of the pelvis was performed according to the standard protocol. Multiplanar CT image reconstructions were also generated. RADIATION DOSE REDUCTION: This exam was performed according to the departmental dose-optimization program which includes automated exposure control, adjustment of the mA and/or kV according to patient size and/or use of iterative reconstruction technique. COMPARISON:  Hip radiograph earlier today. Lumbar spine CT earlier today FINDINGS: Bones/Joint/Cartilage Bones are subjectively under mineralized. Left lateral sacral fracture is minimally displaced, lateral to the sacral foramen. No additional pelvic fracture. The pubic rami are intact. Proximal femurs are intact. No hip dislocation. Mild bilateral hip joint space narrowing and acetabular spurring. Ligaments Suboptimally assessed by CT. Muscles and Tendons No intramuscular hematoma. Soft tissues Colonic diverticulosis without diverticulitis. No presacral hematoma. Scattered subcutaneous edema. IMPRESSION: 1. Minimally displaced left lateral sacral fracture. 2. No additional pelvic fracture. Electronically Signed   By: Andrea Gasman M.D.   On: 11/18/2023 15:27   CT Lumbar Spine Wo Contrast Result Date: 11/18/2023 EXAM: CT OF THE LUMBAR SPINE WITHOUT CONTRAST 11/18/2023 01:23:14 PM TECHNIQUE: CT of the lumbar spine was performed without the administration of intravenous contrast. Multiplanar reformatted images are  provided for review. Automated exposure control, iterative reconstruction, and/or weight based adjustment of the mA/kV was utilized to reduce the radiation dose to as low as reasonably achievable. COMPARISON: None available. CLINICAL HISTORY: Back trauma, no prior imaging (Age >= 16y). Trauma; EMS reports pt with multiple falls (3 over the past week), last one was last night. Pt with dementia, CBG 104, 122/68, HR 88 96%RA. FINDINGS: BONES AND ALIGNMENT: Diffuse osteopenia. Mild curvature of the lumbar spine appears convex towards the right. There are mild compression fractures involving the L2, L3, L4, and L5 vertebrae. Although age indeterminate, the appearance suggests chronic fractures. Most severe fracture is at the L3 level with loss of approximately 20 percent of the vertebral body height. Left sacral wing insufficiency fracture is noted and appears acute (image 114/2). No definite acute fracture of the lumbar vertebrae or subluxation. DEGENERATIVE CHANGES: Moderate disc space narrowing is identified at the L5-S1 level. Multilevel endplate degenerative changes noted. Posterior broad based partially calcified posterior disc bulge identified at the L3-L4 level. Mild central disc bulge/herniation at L5-S1. Bilateral facet arthropathy. SOFT TISSUES: No acute abnormality. Aortic atherosclerotic calcifications. IMPRESSION: 1. Acute left sacral wing insufficiency fracture. 2. Mild multilevel lumbar vertebral compression fractures favored chronic, worst at L3 with approximately 20% height loss. 3. No definite acute lumbar fracture or subluxation. Electronically signed by: Waddell Calk MD 11/18/2023 01:50 PM EDT RP Workstation: [id number]   DG Hip  Unilat W or Wo Pelvis 2-3 Views Left Result Date: 11/18/2023 CLINICAL DATA:  Dementia patient post multiple falls. EXAM: DG HIP (WITH OR WITHOUT PELVIS) 2-3V LEFT COMPARISON:  None Available. FINDINGS: The bones are subjectively under mineralized. No evidence of acute  fracture. No hip dislocation, femoral head is well seated. Mild bilateral hip osteoarthritis. Pubic rami are intact. No pubic symphyseal or sacroiliac diastasis. No focal soft tissue abnormalities. IMPRESSION: 1. No acute fracture or dislocation of the left hip. 2. Mild bilateral hip osteoarthritis. Electronically Signed   By: Andrea Gasman M.D.   On: 11/18/2023 13:48   CT Cervical Spine Wo Contrast Result Date: 11/18/2023 CLINICAL DATA:  Neck trauma.  Dementia patient post multiple falls. EXAM: CT CERVICAL SPINE WITHOUT CONTRAST TECHNIQUE: Multidetector CT imaging of the cervical spine was performed without intravenous contrast. Multiplanar CT image reconstructions were also generated. RADIATION DOSE REDUCTION: This exam was performed according to the departmental dose-optimization program which includes automated exposure control, adjustment of the mA and/or kV according to patient size and/or use of iterative reconstruction technique. COMPARISON:  CT cervical spine 05/10/2019 FINDINGS: Alignment: No traumatic subluxation. Trace grade 1 anterolisthesis of C2 on C3, likely facet mediated. Skull base and vertebrae: No acute fracture. Vertebral body heights are maintained. The dens and skull base are intact. Anterior fusion C5-C6 with intact hardware. Soft tissues and spinal canal: No prevertebral fluid or swelling. No visible canal hematoma. Disc levels: Peripherally calcified disc osteophyte complex at C3-C4 and C4-C5 causes minimal mass effect on the spinal canal. Multilevel facet hypertrophy. Upper chest: No acute findings. Other: None. IMPRESSION: 1. No acute fracture or traumatic subluxation of the cervical spine. 2. Anterior fusion C5-C6 with intact hardware. 3. Multilevel degenerative disc disease and facet hypertrophy. Electronically Signed   By: Andrea Gasman M.D.   On: 11/18/2023 13:47   CT Head Wo Contrast Result Date: 11/18/2023 CLINICAL DATA:  Dementia patient post multiple falls. EXAM: CT  HEAD WITHOUT CONTRAST TECHNIQUE: Contiguous axial images were obtained from the base of the skull through the vertex without intravenous contrast. RADIATION DOSE REDUCTION: This exam was performed according to the departmental dose-optimization program which includes automated exposure control, adjustment of the mA and/or kV according to patient size and/or use of iterative reconstruction technique. COMPARISON:  Head CT 05/10/2019 FINDINGS: Brain: Generalized atrophy with mild progression from prior. Periventricular and deep white matter hypodensity typical of chronic small vessel ischemia, also progressed. No intracranial hemorrhage, mass effect, or midline shift. No hydrocephalus. The basilar cisterns are patent. No evidence of territorial infarct or acute ischemia. No extra-axial or intracranial fluid collection. Vascular: Atherosclerosis of skullbase vasculature without hyperdense vessel or abnormal calcification. Skull: No fracture or focal lesion. Sinuses/Orbits: Paranasal sinuses and mastoid air cells are clear. The visualized orbits are unremarkable. Other: No confluent scalp hematoma. IMPRESSION: 1. No acute intracranial abnormality. No skull fracture. 2. Generalized atrophy and chronic small vessel ischemia, mildly progressed from 2021 exam. Electronically Signed   By: Andrea Gasman M.D.   On: 11/18/2023 13:40    Microbiology: No results found for this or any previous visit.  Labs: CBC: Recent Labs  Lab 11/18/23 1309  WBC 7.8  HGB 13.5  HCT 40.1  MCV 87.6  PLT 409*   Basic Metabolic Panel: Recent Labs  Lab 11/18/23 1309  NA 139  K 3.6  CL 102  CO2 25  GLUCOSE 81  BUN 17  CREATININE 0.62  CALCIUM 9.7   Liver Function Tests: No results for input(s): AST,  ALT, ALKPHOS, BILITOT, PROT, ALBUMIN in the last 168 hours. CBG: No results for input(s): GLUCAP in the last 168 hours.  Discharge time spent: greater than 30 minutes.  Signed: Alm Schneider, MD [facility] 11/22/2023

## 2023-11-22 NOTE — Care Management Important Message (Signed)
 Important Message  Patient Details  Name: [name] MRN: [id number] Date of Birth: Feb 14, 1940   Important Message Given:  N/A - LOS <3 / Initial given by admissions     [name] 11/22/2023, 10:54 AM

## 2023-11-22 NOTE — TOC Transition Note (Signed)
 Transition of Care [facility]) - Discharge Note   Patient Details  Name: [name] MRN: [id number] Date of Birth: Mar 14, 1940  [facility] CM/SW Contact:  Sharlyne Stabs, RN Phone Number: 11/22/2023, 12:31 PM   Clinical Narrative:   Shara approved 10/28 - 10/30, mayme shara id [id number], plan auth id [id number] for [facility]. [place] provided room number, RN calling report. EMS scheduled. Ronal Caldron, Daughter at the bedside. Clinicals sent in the hub.    Final next level of care: Skilled Nursing Facility Barriers to Discharge: Barriers Resolved   Patient Goals and CMS Choice Patient states their goals for this hospitalization and ongoing recovery are:: agreeable to SNF   Discharge Placement               Patient to be transferred to facility by: EMS Name of family member notified: Ronal Caldron Patient and family notified of of transfer: 11/22/23  Discharge Plan and Services Additional resources added to the After Visit Summary for       Social Drivers of Health (SDOH) Interventions SDOH Screenings   Food Insecurity: Patient Unable To Answer (11/18/2023)  Housing: Unknown (11/18/2023)  Transportation Needs: Patient Unable To Answer (11/18/2023)  Utilities: Patient Unable To Answer (11/18/2023)  Social Connections: Patient Unable To Answer (11/18/2023)  Tobacco Use: Low Risk  (11/18/2023)    Readmission Risk Interventions     No data to display

## 2023-11-22 NOTE — Plan of Care (Signed)
  Problem: Clinical Measurements: Goal: Ability to maintain clinical measurements within normal limits will improve Outcome: Progressing Goal: Will remain free from infection Outcome: Progressing Goal: Diagnostic test results will improve Outcome: Progressing Goal: Respiratory complications will improve Outcome: Progressing Goal: Cardiovascular complication will be avoided Outcome: Progressing   Problem: Activity: Goal: Risk for activity intolerance will decrease Outcome: Progressing   Problem: Elimination: Goal: Will not experience complications related to bowel motility Outcome: Progressing   Problem: Pain Managment: Goal: General experience of comfort will improve and/or be controlled Outcome: Progressing   Problem: Skin Integrity: Goal: Risk for impaired skin integrity will decrease Outcome: Progressing

## 2023-11-22 NOTE — Progress Notes (Signed)
 Mobility Specialist Progress Note:    11/22/23 1430  Mobility  Activity Pivoted/transferred from chair to bed  Level of Assistance Moderate assist, patient does 50-74% (+2)  Assistive Device None  Distance Ambulated (ft) 3 ft  Range of Motion/Exercises Active;All extremities  Activity Response Tolerated well  Mobility Referral Yes  Mobility visit 1 Mobility  Mobility Specialist Start Time (ACUTE ONLY) 1430  Mobility Specialist Stop Time (ACUTE ONLY) 1450  Mobility Specialist Time Calculation (min) (ACUTE ONLY) 20 min   Pt received in chair, NT requesting assistance transferring to bed. Required ModA +2 to stand and transfer with no AD. Tolerated well, BLE weakenss and confusion. NT and family in room, all needs met.  [name] Mobility Specialist Please contact via Special Educational Needs Teacher or  Rehab office at [phone]

## 2023-12-01 DIAGNOSIS — S3210XD Unspecified fracture of sacrum, subsequent encounter for fracture with routine healing: Secondary | ICD-10-CM | POA: Diagnosis not present

## 2023-12-06 DIAGNOSIS — R32 Unspecified urinary incontinence: Secondary | ICD-10-CM | POA: Diagnosis not present

## 2023-12-06 DIAGNOSIS — R519 Headache, unspecified: Secondary | ICD-10-CM | POA: Diagnosis not present

## 2023-12-06 DIAGNOSIS — R159 Full incontinence of feces: Secondary | ICD-10-CM | POA: Diagnosis not present

## 2023-12-06 DIAGNOSIS — G8929 Other chronic pain: Secondary | ICD-10-CM | POA: Diagnosis not present

## 2023-12-06 DIAGNOSIS — S3210XD Unspecified fracture of sacrum, subsequent encounter for fracture with routine healing: Secondary | ICD-10-CM | POA: Diagnosis not present

## 2023-12-06 DIAGNOSIS — I10 Essential (primary) hypertension: Secondary | ICD-10-CM | POA: Diagnosis not present

## 2023-12-06 DIAGNOSIS — R262 Difficulty in walking, not elsewhere classified: Secondary | ICD-10-CM | POA: Diagnosis not present

## 2023-12-06 DIAGNOSIS — F03C0 Unspecified dementia, severe, without behavioral disturbance, psychotic disturbance, mood disturbance, and anxiety: Secondary | ICD-10-CM | POA: Diagnosis not present

## 2023-12-07 DIAGNOSIS — I10 Essential (primary) hypertension: Secondary | ICD-10-CM | POA: Diagnosis not present

## 2023-12-07 DIAGNOSIS — Z79899 Other long term (current) drug therapy: Secondary | ICD-10-CM | POA: Diagnosis not present

## 2023-12-07 DIAGNOSIS — F03C0 Unspecified dementia, severe, without behavioral disturbance, psychotic disturbance, mood disturbance, and anxiety: Secondary | ICD-10-CM | POA: Diagnosis not present

## 2023-12-13 DIAGNOSIS — G8929 Other chronic pain: Secondary | ICD-10-CM | POA: Diagnosis not present

## 2023-12-13 DIAGNOSIS — R945 Abnormal results of liver function studies: Secondary | ICD-10-CM | POA: Diagnosis not present

## 2023-12-13 DIAGNOSIS — R262 Difficulty in walking, not elsewhere classified: Secondary | ICD-10-CM | POA: Diagnosis not present

## 2023-12-13 DIAGNOSIS — E119 Type 2 diabetes mellitus without complications: Secondary | ICD-10-CM | POA: Diagnosis not present

## 2023-12-19 DIAGNOSIS — M545 Low back pain, unspecified: Secondary | ICD-10-CM | POA: Diagnosis not present

## 2023-12-19 DIAGNOSIS — M25551 Pain in right hip: Secondary | ICD-10-CM | POA: Diagnosis not present

## 2023-12-19 DIAGNOSIS — R262 Difficulty in walking, not elsewhere classified: Secondary | ICD-10-CM | POA: Diagnosis not present

## 2023-12-19 DIAGNOSIS — M25552 Pain in left hip: Secondary | ICD-10-CM | POA: Diagnosis not present

## 2023-12-22 ENCOUNTER — Encounter (HOSPITAL_COMMUNITY): Payer: Self-pay | Admitting: Emergency Medicine

## 2023-12-22 ENCOUNTER — Emergency Department (HOSPITAL_COMMUNITY)

## 2023-12-22 ENCOUNTER — Emergency Department (HOSPITAL_COMMUNITY)
Admission: EM | Admit: 2023-12-22 | Discharge: 2023-12-22 | Disposition: A | Discharge status: Skilled Nursing Facility | Attending: Emergency Medicine | Admitting: Emergency Medicine

## 2023-12-22 ENCOUNTER — Other Ambulatory Visit: Payer: Self-pay

## 2023-12-22 DIAGNOSIS — Z7401 Bed confinement status: Secondary | ICD-10-CM | POA: Diagnosis not present

## 2023-12-22 DIAGNOSIS — M25572 Pain in left ankle and joints of left foot: Secondary | ICD-10-CM | POA: Diagnosis not present

## 2023-12-22 DIAGNOSIS — F0390 Unspecified dementia without behavioral disturbance: Secondary | ICD-10-CM | POA: Insufficient documentation

## 2023-12-22 DIAGNOSIS — W19XXXA Unspecified fall, initial encounter: Secondary | ICD-10-CM | POA: Diagnosis not present

## 2023-12-22 DIAGNOSIS — I959 Hypotension, unspecified: Secondary | ICD-10-CM | POA: Diagnosis not present

## 2023-12-22 DIAGNOSIS — M25551 Pain in right hip: Secondary | ICD-10-CM | POA: Diagnosis not present

## 2023-12-22 DIAGNOSIS — R102 Pelvic and perineal pain unspecified side: Secondary | ICD-10-CM

## 2023-12-22 LAB — URINALYSIS, ROUTINE W REFLEX MICROSCOPIC
Bilirubin Urine: NEGATIVE
Glucose, UA: NEGATIVE mg/dL
Hgb urine dipstick: NEGATIVE
Ketones, ur: NEGATIVE mg/dL
Leukocytes,Ua: NEGATIVE
Nitrite: POSITIVE — AB
Protein, ur: NEGATIVE mg/dL
Specific Gravity, Urine: 1.01 (ref 1.005–1.030)
pH: 6 (ref 5.0–8.0)

## 2023-12-22 LAB — BASIC METABOLIC PANEL WITH GFR
Anion gap: 11 (ref 5–15)
BUN: 9 mg/dL (ref 8–23)
CO2: 26 mmol/L (ref 22–32)
Calcium: 9.8 mg/dL (ref 8.9–10.3)
Chloride: 102 mmol/L (ref 98–111)
Creatinine, Ser: 0.6 mg/dL (ref 0.44–1.00)
GFR, Estimated: >60 mL/min (ref >60)
Glucose, Bld: 90 mg/dL (ref 70–99)
Potassium: 3.4 mmol/L — ABNORMAL LOW (ref 3.5–5.1)
Sodium: 139 mmol/L (ref 135–145)

## 2023-12-22 LAB — CBC
HCT: 39.1 % (ref 36.0–46.0)
Hemoglobin: 12.9 g/dL (ref 12.0–15.0)
MCH: 29.5 pg (ref 26.0–34.0)
MCHC: 33 g/dL (ref 30.0–36.0)
MCV: 89.3 fL (ref 80.0–100.0)
Platelets: 453 K/uL — ABNORMAL HIGH (ref 150–400)
RBC: 4.38 MIL/uL (ref 3.87–5.11)
RDW: 14.3 % (ref 11.5–15.5)
WBC: 6 K/uL (ref 4.0–10.5)
nRBC: 0 % (ref 0.0–0.2)

## 2023-12-22 NOTE — ED Provider Notes (Signed)
  Physical Exam  BP 132/69 (BP Location: Right Arm)   Pulse 78   Temp (!) 97.5 F (36.4 C) (Oral)   Resp 18   SpO2 99%   Physical Exam  Procedures  Procedures  ED Course / MDM    Medical Decision Making Amount and/or Complexity of Data Reviewed Labs: ordered. Radiology: ordered.   Patient to ED from [facility], history of dementia, here with daughter. Stopped walking one week ago and went to wheelchair. She had fallen one month ago and had a pelvic fracture but was ambulatory after this injury until one week ago. Complains of pain in right hip on movement.   CT head and c spine - negative Right hip - negative.  CT pelvis ordered for further investigation as to her not ambulating, however, daughter satisfied with current work up and requests discharge back to facility.   Will discharge back per PTAR dur to nonambulatory status. PCP to recheck patient.       Odell Balls, PA-C 12/22/23 [address], [place], DO 12/23/23 [phone]

## 2023-12-22 NOTE — Discharge Instructions (Signed)
 As we discussed, all imaging studies today are negative for fracture or identified injury. You can be discharged back to [facility] and can expect to be bedridden until you can walk safely.

## 2023-12-22 NOTE — ED Triage Notes (Addendum)
 Pt arrived via [facility] from [facility] after a fall at the facility. Last Saturday pt changed from her baseline of walking around to being wheelchair bound. Pt was found on the floor last night, and is c/o right hip pain. Facility and daughter think other unwitnessed falls have occurred at the facility.

## 2023-12-22 NOTE — ED Provider Notes (Signed)
 [place] [facility] DEPARTMENT AT [facility] Provider Note   CSN: [id number] Arrival date & time: 12/22/23  1227     Patient presents with: [place]   [name] is a 83 y.o. female.  {Add pertinent medical, surgical, social history, OB history to YEP:67052} Patient with history of dementia presents today from [facility] with complaints of fall. Patient with advanced dementia, history provided by daughter at bedside. Patient sustained a fall 1 month ago, had a pelvic fracture from this, was discharged to memory care with plans to stay there. Was originally up and walking around speed walking laps around the facility until last Friday 1 week ago when she stopped walking completely and became wheelchair bound. Patient sustained an unwitnessed fall out of bed last night, was found on the ground.  She is not on anticoagulation.  Since then has been complaining of right hip pain with movement. Reports she is at her baseline mental status.   Level 5 caveat -- dementia   The history is provided by the patient. No language interpreter was used.  Fall       Prior to Admission medications   Medication Sig Start Date End Date Taking? Authorizing Provider  acetaminophen  (TYLENOL ) 500 MG tablet Take 2 tablets (1,000 mg total) by mouth every 8 (eight) hours. 11/22/23   Evonnie Lenis, MD  amLODipine  (NORVASC ) 5 MG tablet Take 1 tablet (5 mg total) by mouth daily. 11/23/23   Evonnie Lenis, MD  metoprolol  tartrate (LOPRESSOR ) 25 MG tablet Take 0.5 tablets (12.5 mg total) by mouth 2 (two) times daily. 11/22/23   Evonnie Lenis, MD  polyethylene glycol (MIRALAX  / GLYCOLAX ) 17 g packet Take 17 g by mouth daily as needed for mild constipation. 11/22/23   Evonnie Lenis, MD    Allergies: Patient has no known allergies.    Review of Systems  Unable to perform ROS: Dementia  Musculoskeletal:  Positive for arthralgias.    Updated Vital Signs BP 130/68 (BP Location: Right Arm)   Pulse 63    Temp (!) 97.5 F (36.4 C) (Oral)   Resp 16   SpO2 99%   Physical Exam Vitals and nursing note reviewed.  Constitutional:      General: She is not in acute distress.    Appearance: Normal appearance. She is normal weight. She is not ill-appearing, toxic-appearing or diaphoretic.  HENT:     Head: Normocephalic and atraumatic.     Comments: No racoon eyes No battle sign Eyes:     Extraocular Movements: Extraocular movements intact.     Pupils: Pupils are equal, round, and reactive to light.  Cardiovascular:     Rate and Rhythm: Normal rate.     Comments: No tenderness to palpation of the anterior chest wall Pulmonary:     Effort: Pulmonary effort is normal. No respiratory distress.  Abdominal:     Comments: No abdominal tenderness or bruising  Musculoskeletal:        General: Normal range of motion.     Cervical back: Normal and normal range of motion.     Thoracic back: Normal.     Lumbar back: Normal.     Comments: No tenderness to palpation of the cervical, thoracic, or lumbar spine.  No step-offs, lesions, deformity, or overlying skin changes.  TTP noted to right hip with any movement, none noted to direct palpation. DP and PT pulses palpable  Skin:    General: Skin is warm and dry.  Neurological:  General: No focal deficit present.     Mental Status: She is alert and oriented to person, place, and time.  Psychiatric:        Mood and Affect: Mood normal.        Behavior: Behavior normal.     (all labs ordered are listed, but only abnormal results are displayed) Labs Reviewed  CBC - Abnormal; Notable for the following components:      Result Value   Platelets 453 (*)    All other components within normal limits  BASIC METABOLIC PANEL WITH GFR - Abnormal; Notable for the following components:   Potassium 3.4 (*)    All other components within normal limits  URINALYSIS, ROUTINE W REFLEX MICROSCOPIC - Abnormal; Notable for the following components:   Nitrite  POSITIVE (*)    Bacteria, UA RARE (*)    All other components within normal limits    EKG: None  Radiology: CT Head Wo Contrast Result Date: 12/22/2023 CLINICAL DATA:  Last week and patient change from baseline walking around 2 being wheelchair-bound. Found on the floor last night. Right hip pain. EXAM: CT HEAD WITHOUT CONTRAST CT CERVICAL SPINE WITHOUT CONTRAST TECHNIQUE: Multidetector CT imaging of the head and cervical spine was performed following the standard protocol without intravenous contrast. Multiplanar CT image reconstructions of the cervical spine were also generated. RADIATION DOSE REDUCTION: This exam was performed according to the departmental dose-optimization program which includes automated exposure control, adjustment of the mA and/or kV according to patient size and/or use of iterative reconstruction technique. COMPARISON:  Head CT, 11/18/2023. FINDINGS: CT HEAD FINDINGS Brain: No evidence of acute infarction, hemorrhage, hydrocephalus, extra-axial collection or mass lesion/mass effect. There is ventricular sulcal enlargement reflecting mild to moderate generalized atrophy. There is also patchy bilateral white matter hypoattenuation consistent with chronic microvascular ischemic change. These findings are stable from the recent prior head CT. Vascular: No hyperdense vessel or unexpected calcification. Skull: Normal. Negative for fracture or focal lesion. Sinuses/Orbits: Globes and orbits are unremarkable. Mucous retention cyst in the floor the right maxillary sinus. Sinuses otherwise clear. Other: None. CT CERVICAL SPINE FINDINGS Alignment: Normal. Skull base and vertebrae: No acute fracture. No primary bone lesion or focal pathologic process. Soft tissues and spinal canal: No prevertebral fluid or swelling. No visible canal hematoma. Disc levels: Previous anterior cervical disc fusion at C5-C6 with mature bone spanning the C5-C6 disc interspace. Anterior fusion plate and screws are  well-seated and positioned. No convincing disc herniation. Facet degenerative changes noted predominantly on the right at C2-C3. Upper chest: No acute finding. Other: None. IMPRESSION: HEAD CT 1. No acute intracranial abnormalities. CERVICAL CT 1. No fracture or acute finding. Electronically Signed   By: Alm Parkins M.D.   On: 12/22/2023 14:19   CT Cervical Spine Wo Contrast Result Date: 12/22/2023 CLINICAL DATA:  Last week and patient change from baseline walking around 2 being wheelchair-bound. Found on the floor last night. Right hip pain. EXAM: CT HEAD WITHOUT CONTRAST CT CERVICAL SPINE WITHOUT CONTRAST TECHNIQUE: Multidetector CT imaging of the head and cervical spine was performed following the standard protocol without intravenous contrast. Multiplanar CT image reconstructions of the cervical spine were also generated. RADIATION DOSE REDUCTION: This exam was performed according to the departmental dose-optimization program which includes automated exposure control, adjustment of the mA and/or kV according to patient size and/or use of iterative reconstruction technique. COMPARISON:  Head CT, 11/18/2023. FINDINGS: CT HEAD FINDINGS Brain: No evidence of acute infarction, hemorrhage, hydrocephalus, extra-axial collection  or mass lesion/mass effect. There is ventricular sulcal enlargement reflecting mild to moderate generalized atrophy. There is also patchy bilateral white matter hypoattenuation consistent with chronic microvascular ischemic change. These findings are stable from the recent prior head CT. Vascular: No hyperdense vessel or unexpected calcification. Skull: Normal. Negative for fracture or focal lesion. Sinuses/Orbits: Globes and orbits are unremarkable. Mucous retention cyst in the floor the right maxillary sinus. Sinuses otherwise clear. Other: None. CT CERVICAL SPINE FINDINGS Alignment: Normal. Skull base and vertebrae: No acute fracture. No primary bone lesion or focal pathologic process.  Soft tissues and spinal canal: No prevertebral fluid or swelling. No visible canal hematoma. Disc levels: Previous anterior cervical disc fusion at C5-C6 with mature bone spanning the C5-C6 disc interspace. Anterior fusion plate and screws are well-seated and positioned. No convincing disc herniation. Facet degenerative changes noted predominantly on the right at C2-C3. Upper chest: No acute finding. Other: None. IMPRESSION: HEAD CT 1. No acute intracranial abnormalities. CERVICAL CT 1. No fracture or acute finding. Electronically Signed   By: Alm Parkins M.D.   On: 12/22/2023 14:19   DG Hip Unilat W or Wo Pelvis 2-3 Views Right Result Date: 12/22/2023 CLINICAL DATA:  [place] last night.  Right hip pain. EXAM: DG HIP (WITH OR WITHOUT PELVIS) 2-3V RIGHT COMPARISON:  None Available. FINDINGS: No fracture or bone lesion. Hip joints are normally spaced and aligned as are the SI joints and pubic symphysis. Skeletal structures are demineralized. Soft tissues are unremarkable. IMPRESSION: 1. No fracture or hip joint abnormality. Electronically Signed   By: Alm Parkins M.D.   On: 12/22/2023 13:47    {Document cardiac monitor, telemetry assessment procedure when appropriate:32947} Procedures   Medications Ordered in the ED - No data to display    {Click here for ABCD2, HEART and other calculators REFRESH Note before signing:1}                              Medical Decision Making Amount and/or Complexity of Data Reviewed Labs: ordered. Radiology: ordered.   This patient is a 83 y.o. female who presents to the ED for concern of fall, this involves an extensive number of treatment options, and is a complaint that carries with it a high risk of complications and morbidity. The emergent differential diagnosis prior to evaluation includes, but is not limited to,  trauma . This is not an exhaustive differential.   Past Medical History / Co-morbidities / Social History:  has a past medical history of  Dementia (HCC), Head injury, Headache, and Memory loss.  Additional history: Chart reviewed. Pertinent results include: seen by me at [facility] on 10/25 after fall at home, admitted with left lateral sacral fracture, discharged to [facility] memory care  Physical Exam: Physical exam performed. The pertinent findings include: TTP noted to right hip with any movement, none noted to direct palpation. DP and PT pulses palpable  Lab Tests: I ordered, and personally interpreted labs.  The pertinent results include:  ***   Imaging Studies: I ordered imaging studies including ***. I independently visualized and interpreted imaging which showed ***. I agree with the radiologist interpretation.   Cardiac Monitoring:  The patient was maintained on a cardiac monitor.  My attending physician Dr. PIERRETTE viewed and interpreted the cardiac monitored which showed an underlying rhythm of: ***. I agree with this interpretation.   Medications: I ordered medication including ***  for ***. Reevaluation of the patient after  these medicines showed that the patient {resolved/improved/worsened:23923::improved}. I have reviewed the patients home medicines and have made adjustments as needed.  Consultations Obtained: I requested consultation with the ***,  and discussed lab and imaging findings as well as pertinent plan - they recommend: ***   Disposition: After consideration of the diagnostic results and the patients response to treatment, I feel that *** .   ***emergency department workup does not suggest an emergent condition requiring admission or immediate intervention beyond what has been performed at this time. The plan is: ***. The patient is safe for discharge and has been instructed to return immediately for worsening symptoms, change in symptoms or any other concerns.  I discussed this case with my attending physician Dr. PIERRETTE who cosigned this note including patient's presenting symptoms, physical exam, and  planned diagnostics and interventions. Attending physician stated agreement with plan or made changes to plan which were implemented.     {Document critical care time when appropriate  Document review of labs and clinical decision tools ie CHADS2VASC2, etc  Document your independent review of radiology images and any outside records  Document your discussion with family members, caretakers and with consultants  Document social determinants of health affecting pt's care  Document your decision making why or why not admission, treatments were needed:32947:::1}   Final diagnoses:  None    ED Discharge Orders     None

## 2023-12-22 NOTE — ED Notes (Signed)
 Attempted to call [facility] (3x) for report; they did not answer; left voicemail and daughter is aware

## 2023-12-27 DIAGNOSIS — R748 Abnormal levels of other serum enzymes: Secondary | ICD-10-CM | POA: Diagnosis not present

## 2023-12-31 DIAGNOSIS — S3210XD Unspecified fracture of sacrum, subsequent encounter for fracture with routine healing: Secondary | ICD-10-CM | POA: Diagnosis not present

## 2024-01-03 ENCOUNTER — Telehealth: Payer: Self-pay | Admitting: Internal Medicine

## 2024-01-03 DIAGNOSIS — S3210XD Unspecified fracture of sacrum, subsequent encounter for fracture with routine healing: Secondary | ICD-10-CM | POA: Diagnosis not present

## 2024-01-03 DIAGNOSIS — G8929 Other chronic pain: Secondary | ICD-10-CM | POA: Diagnosis not present

## 2024-01-03 DIAGNOSIS — R945 Abnormal results of liver function studies: Secondary | ICD-10-CM | POA: Diagnosis not present

## 2024-01-03 DIAGNOSIS — R7401 Elevation of levels of liver transaminase levels: Secondary | ICD-10-CM | POA: Diagnosis not present

## 2024-01-03 DIAGNOSIS — Z79899 Other long term (current) drug therapy: Secondary | ICD-10-CM | POA: Diagnosis not present

## 2024-01-03 DIAGNOSIS — F03C0 Unspecified dementia, severe, without behavioral disturbance, psychotic disturbance, mood disturbance, and anxiety: Secondary | ICD-10-CM | POA: Diagnosis not present

## 2024-01-03 DIAGNOSIS — M545 Low back pain, unspecified: Secondary | ICD-10-CM | POA: Diagnosis not present

## 2024-01-03 DIAGNOSIS — R262 Difficulty in walking, not elsewhere classified: Secondary | ICD-10-CM | POA: Diagnosis not present

## 2024-01-03 DIAGNOSIS — I10 Essential (primary) hypertension: Secondary | ICD-10-CM | POA: Diagnosis not present

## 2024-01-03 NOTE — Telephone Encounter (Signed)
 Referral for elevated liver enzymes in located in the media tab.
